# Patient Record
Sex: Male | Born: 1970 | Race: White | Hispanic: No | Marital: Married | Smoking: Never smoker
Health system: Southern US, Community
[De-identification: ages and names within clinical notes are randomized; demographics above are authoritative.]

## PROBLEM LIST (undated history)

## (undated) DIAGNOSIS — J45909 Unspecified asthma, uncomplicated: Secondary | ICD-10-CM

## (undated) DIAGNOSIS — I10 Essential (primary) hypertension: Secondary | ICD-10-CM

## (undated) DIAGNOSIS — S060X9A Concussion with loss of consciousness of unspecified duration, initial encounter: Secondary | ICD-10-CM

## (undated) DIAGNOSIS — S0292XA Unspecified fracture of facial bones, initial encounter for closed fracture: Secondary | ICD-10-CM

## (undated) DIAGNOSIS — D497 Neoplasm of unspecified behavior of endocrine glands and other parts of nervous system: Secondary | ICD-10-CM

## (undated) DIAGNOSIS — F419 Anxiety disorder, unspecified: Secondary | ICD-10-CM

## (undated) DIAGNOSIS — E785 Hyperlipidemia, unspecified: Secondary | ICD-10-CM

## (undated) DIAGNOSIS — E669 Obesity, unspecified: Secondary | ICD-10-CM

## (undated) DIAGNOSIS — T7840XA Allergy, unspecified, initial encounter: Secondary | ICD-10-CM

## (undated) DIAGNOSIS — M1990 Unspecified osteoarthritis, unspecified site: Secondary | ICD-10-CM

## (undated) DIAGNOSIS — E221 Hyperprolactinemia: Secondary | ICD-10-CM

## (undated) DIAGNOSIS — R7989 Other specified abnormal findings of blood chemistry: Secondary | ICD-10-CM

## (undated) HISTORY — DX: Essential (primary) hypertension: I10

## (undated) HISTORY — DX: Hyperprolactinemia: E22.1

## (undated) HISTORY — DX: Hyperlipidemia, unspecified: E78.5

## (undated) HISTORY — DX: Anxiety disorder, unspecified: F41.9

## (undated) HISTORY — DX: Unspecified asthma, uncomplicated: J45.909

## (undated) HISTORY — DX: Allergy, unspecified, initial encounter: T78.40XA

## (undated) HISTORY — DX: Unspecified osteoarthritis, unspecified site: M19.90

## (undated) HISTORY — DX: Concussion with loss of consciousness of unspecified duration, initial encounter: S06.0X9A

## (undated) HISTORY — DX: Neoplasm of unspecified behavior of endocrine glands and other parts of nervous system: D49.7

## (undated) HISTORY — PX: COLONOSCOPY: SHX174

## (undated) HISTORY — DX: Obesity, unspecified: E66.9

## (undated) HISTORY — DX: Other specified abnormal findings of blood chemistry: R79.89

## (undated) HISTORY — DX: Unspecified fracture of facial bones, initial encounter for closed fracture: S02.92XA

---
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
## 2012-05-23 ENCOUNTER — Ambulatory Visit (INDEPENDENT_AMBULATORY_CARE_PROVIDER_SITE_OTHER): Payer: BC Managed Care – PPO | Admitting: Physician Assistant

## 2012-05-23 VITALS — BP 142/98 | HR 88 | Temp 97.7°F | Resp 16 | Ht 73.0 in | Wt 290.4 lb

## 2012-05-23 DIAGNOSIS — R05 Cough: Secondary | ICD-10-CM

## 2012-05-23 DIAGNOSIS — J40 Bronchitis, not specified as acute or chronic: Secondary | ICD-10-CM

## 2012-05-23 DIAGNOSIS — R059 Cough, unspecified: Secondary | ICD-10-CM

## 2012-05-23 DIAGNOSIS — J309 Allergic rhinitis, unspecified: Secondary | ICD-10-CM

## 2012-05-23 MED ORDER — METHYLPREDNISOLONE ACETATE 80 MG/ML IJ SUSP
80.0000 mg | Freq: Once | INTRAMUSCULAR | Status: AC
  Administered 2012-05-23: 80 mg via INTRAMUSCULAR

## 2012-05-23 MED ORDER — ALBUTEROL SULFATE HFA 108 (90 BASE) MCG/ACT IN AERS: 2.0000 | INHALATION_SPRAY | RESPIRATORY_TRACT | Status: DC | PRN

## 2012-05-23 MED ORDER — IPRATROPIUM BROMIDE 0.03 % NA SOLN: 2.0000 | Freq: Two times a day (BID) | NASAL | Status: DC

## 2012-05-23 MED ORDER — HYDROCOD POLST-CHLORPHEN POLST 10-8 MG/5ML PO LQCR: 5.0000 mL | Freq: Two times a day (BID) | ORAL | Status: DC | PRN

## 2012-05-23 MED ORDER — AZITHROMYCIN 250 MG PO TABS: ORAL_TABLET | ORAL | Status: DC

## 2012-05-23 NOTE — Progress Notes (Signed)
  Subjective:    Patient ID: David Aguilar, male    DOB: 12-15-1970, 41 y.o.   MRN: [id number]  HPI 41 year old male presents with 3 week history of cough, allergic rhinitis, nasal congestion, and postnasal drainage.  Denies sore throat, otalgia, fevers, chills, nausea, or vomiting.  No history of asthma or inhaler use, but does admit to some wheezing with this illness.  Says his wife and young daughter are both sick as well with cough and congestion. He has been taking Allegra daily x 4 weeks but says it is not helping much.  No history of diabetes or hypertension, but says blood pressure has been borderline. Has been working on exercise and weight loss, and has lost about 5 pounds since last month.      Review of Systems  Constitutional: Negative for fever and chills.  HENT: Positive for congestion, rhinorrhea and postnasal drip. Negative for ear pain, sore throat and neck pain.   Respiratory: Positive for cough and chest tightness.   Cardiovascular: Negative for chest pain.  Gastrointestinal: Negative for nausea and vomiting.  All other systems reviewed and are negative.       Objective:   Physical Exam  Constitutional: He is oriented to person, place, and time. He appears well-developed and well-nourished.  HENT:  Head: Normocephalic and atraumatic.  Right Ear: External ear normal.  Left Ear: External ear normal.  Mouth/Throat: No oropharyngeal exudate.  Eyes: Conjunctivae normal are normal.  Neck: Normal range of motion.  Cardiovascular: Normal rate, regular rhythm and normal heart sounds.   Pulmonary/Chest: Effort normal and breath sounds normal.  Musculoskeletal: Normal range of motion.  Lymphadenopathy:    He has no cervical adenopathy.  Neurological: He is alert and oriented to person, place, and time.  Psychiatric: He has a normal mood and affect. His behavior is normal. Judgment and thought content normal.          Assessment & Plan:   1. Allergic rhinitis   methylPREDNISolone acetate (DEPO-MEDROL) injection 80 mg, ipratropium (ATROVENT) 0.03 % nasal spray  2. Cough  chlorpheniramine-HYDROcodone (TUSSIONEX PENNKINETIC ER) 10-8 MG/5ML LQCR  3. Bronchitis  azithromycin (ZITHROMAX) 250 MG tablet, albuterol (PROVENTIL HFA;VENTOLIN HFA) 108 (90 BASE) MCG/ACT inhaler   Continue Allegra daily Zpack Albuterol as needed for chest tightness.  Atrovent NS bid Tussionex prn cough

## 2012-08-16 ENCOUNTER — Ambulatory Visit (INDEPENDENT_AMBULATORY_CARE_PROVIDER_SITE_OTHER): Payer: BC Managed Care – PPO | Admitting: Family Medicine

## 2012-08-16 ENCOUNTER — Encounter: Payer: Self-pay | Admitting: Family Medicine

## 2012-08-16 VITALS — BP 136/95 | HR 95 | Temp 98.0°F | Resp 18 | Ht 73.5 in | Wt 297.0 lb

## 2012-08-16 DIAGNOSIS — R059 Cough, unspecified: Secondary | ICD-10-CM

## 2012-08-16 DIAGNOSIS — R030 Elevated blood-pressure reading, without diagnosis of hypertension: Secondary | ICD-10-CM

## 2012-08-16 DIAGNOSIS — J309 Allergic rhinitis, unspecified: Secondary | ICD-10-CM

## 2012-08-16 DIAGNOSIS — R05 Cough: Secondary | ICD-10-CM

## 2012-08-16 MED ORDER — FLUTICASONE PROPIONATE 50 MCG/ACT NA SUSP: 2.0000 | Freq: Every day | NASAL | Status: DC

## 2012-08-16 MED ORDER — MONTELUKAST SODIUM 10 MG PO TABS: 10.0000 mg | ORAL_TABLET | Freq: Every day | ORAL | Status: DC

## 2012-08-16 NOTE — Progress Notes (Signed)
S: This 42 y.o. Cauc male has hx of allergies dating back decades. He has tried most antihistamines and recalls using Flonase in the 1990s w/ good results. He has a dry cough and drainage in back of throat that is very irritating. Has chronic "throat-clearing" and notes occasional wheezing. He breaths better at night when using Breath-Right Strips; also has used OTC nasal spray (in place of a NETI pot).  Uses a humidifier. Wife is concerned that he may have sleep apnea; pt denies daytime sleepiness, feels well-rested upon awakening and has no urge to nap during the day.  Pt also concerned about elevated bP reading obtained at home; average readings= 120-150/70-90. He tries to walk most days of the week and BP reading is much better after exercise (120/70). He denies CP or tightness, SOb or DOE, palpitations, edema, myalgias, HA, dizziness, weakness, numbness or syncope.  ROS; As per HPI>  O:  Filed Vitals:   08/16/12 1103 08/16/12 1158  BP: 158/105 136/95  Pulse: 95 95  Temp: 98 F (36.7 C)   Resp: 18   Height: 6' 1.5" (1.867 m)   Weight: 297 lb (134.718 kg)    GNE: In NAD; WN,WD. Obese. HEENT: [place]/AT; EOMI w/ clear conj/sclerae. EACs normal. TMs dull. Nontender sinuses w/ percussion. Post ph is somewhat narrow, erythematous. NECK: Supple w/o LAN or TMG. COR: RRR.; no m/g/r. Normal S1, S2. LUNGS: CTA w/o wheezes or rhonchi. Normal resp rate and effort. SKIN: W&D; no pallor or rashes. NEURO: A&O x 3; CNs intact. Nonfocal.   A/P:  1. Allergic rhinitis   RX: Fluticasone NS  2 spr EN hs; continue other measures. RX: Montelukast 10 mg   1 tablet at bedtime.  2. Cough    Use Albuterol MDI prn wheezing  3. Elevated blood-pressure reading without diagnosis of hypertension   Weight reduction.

## 2012-08-16 NOTE — Patient Instructions (Addendum)
Allergic Rhinitis Allergic rhinitis is when the mucous membranes in the nose respond to allergens. Allergens are particles in the air that cause your body to have an allergic reaction. This causes you to release allergic antibodies. Through a chain of events, these eventually cause you to release histamine into the blood stream (hence the use of antihistamines). Although meant to be protective to the body, it is this release that causes your discomfort, such as frequent sneezing, congestion and an itchy runny nose.  CAUSES  The pollen allergens may come from grasses, trees, and weeds. This is seasonal allergic rhinitis, or "hay fever." Other allergens cause year-round allergic rhinitis (perennial allergic rhinitis) such as house dust mite allergen, pet dander and mold spores.  SYMPTOMS   Nasal stuffiness (congestion).  Runny, itchy nose with sneezing and tearing of the eyes.  There is often an itching of the mouth, eyes and ears. It cannot be cured, but it can be controlled with medications. DIAGNOSIS  If you are unable to determine the offending allergen, skin or blood testing may find it. TREATMENT   Avoid the allergen.  Medications and allergy shots (immunotherapy) can help.  Hay fever may often be treated with antihistamines in pill or nasal spray forms. Antihistamines block the effects of histamine. There are over-the-counter medicines that may help with nasal congestion and swelling around the eyes. Check with your caregiver before taking or giving this medicine. If the treatment above does not work, there are many new medications your caregiver can prescribe. Stronger medications may be used if initial measures are ineffective. Desensitizing injections can be used if medications and avoidance fails. Desensitization is when a patient is given ongoing shots until the body becomes less sensitive to the allergen. Make sure you follow up with your caregiver if problems continue. SEEK MEDICAL  CARE IF:   You develop fever (more than 100.5 F (38.1 C).  You develop a cough that does not stop easily (persistent).  You have shortness of breath.  You start wheezing.  Symptoms interfere with normal daily activities. Document Released: 04/11/2001 Document Revised: 10/09/2011 Document Reviewed: 10/21/2008 [facility] Patient Information 2013 [place], [place].    Hypertension Hypertension is another name for high blood pressure. High blood pressure may mean that your heart needs to work harder to pump blood. Blood pressure consists of two numbers, which includes a higher number over a lower number (example: 110/72). HOME CARE   Make lifestyle changes as told by your doctor. This may include weight loss and exercise.  Take your blood pressure medicine every day.  Limit how much salt you use.  Stop smoking if you smoke.  Do not use drugs.  Talk to your doctor if you are using decongestants or birth control pills. These medicines might make blood pressure higher.  Females should not drink more than 1 alcoholic drink per day. Males should not drink more than 2 alcoholic drinks per day.  See your doctor as told. GET HELP RIGHT AWAY IF:   You have a blood pressure reading with a top number of 180 or higher.  You get a very bad headache.  You get blurred or changing vision.  You feel confused.  You feel weak, numb, or faint.  You get chest or belly (abdominal) pain.  You throw up (vomit).  You cannot breathe very well. MAKE SURE YOU:   Understand these instructions.  Will watch your condition.  Will get help right away if you are not doing well or get worse.  Document Released: 01/03/2008 Document Revised: 10/09/2011 Document Reviewed: 01/03/2008 [facility] Patient Information 2013 [place], [place].

## 2012-09-19 ENCOUNTER — Telehealth: Payer: Self-pay

## 2012-09-19 NOTE — Telephone Encounter (Signed)
I do not routinely prescribe antibiotics for "sinus infections" without seeing the patient. He may have to try to get evaluated at [facility]. He may want to try OTC Mucinex D, use a humidifier and OTC saline nasal rinse.

## 2012-09-19 NOTE — Telephone Encounter (Signed)
I have advised patient 

## 2012-09-19 NOTE — Telephone Encounter (Signed)
David Aguilar - PT HAD TO CANCEL HIS CPE DUE TO THE FACT THAT HIS WIFE IS SCHEDULED TO DELIVER TWINS ON THAT DAY.  SAYS HE IS GETTING A SINUS INFECTION AND WONDERED IF THERE IS ANY WAY David Aguilar COULD CALL HIM IN A Z-PACK.  SAYS HE HAS BEEN TREATED FOR THIS BEFORE.  HE PLANS TO RESCHEDULE AS SOON AS HE CAN.  PLEASE CALL [phone]

## 2012-09-25 ENCOUNTER — Encounter: Payer: BC Managed Care – PPO | Admitting: Family Medicine

## 2012-11-12 ENCOUNTER — Telehealth: Payer: Self-pay

## 2012-11-12 DIAGNOSIS — J40 Bronchitis, not specified as acute or chronic: Secondary | ICD-10-CM

## 2012-11-12 MED ORDER — ALBUTEROL SULFATE HFA 108 (90 BASE) MCG/ACT IN AERS: 2.0000 | INHALATION_SPRAY | RESPIRATORY_TRACT | Status: DC | PRN

## 2012-11-12 NOTE — Telephone Encounter (Signed)
I refilled Albuterol w/ additional refills so that pt should have enough medication until he can come in.

## 2012-11-12 NOTE — Telephone Encounter (Signed)
Patient had to reschedule his CPE in February due to the birth of his twins.  He has now rescheduled it with [facility] for May.  Wants to know if we can please refill his albuterol inhaler until then.  Says the pharmacy sent over a refill request four-five days ago, but has not heard back from [place].  [phone]

## 2012-11-12 NOTE — Telephone Encounter (Signed)
Thanks, I did advise him.

## 2012-12-12 ENCOUNTER — Encounter: Payer: Self-pay | Admitting: Family Medicine

## 2012-12-12 ENCOUNTER — Ambulatory Visit (INDEPENDENT_AMBULATORY_CARE_PROVIDER_SITE_OTHER): Payer: BC Managed Care – PPO | Admitting: Family Medicine

## 2012-12-12 VITALS — BP 131/92 | HR 100 | Temp 97.4°F | Resp 17 | Wt 290.0 lb

## 2012-12-12 DIAGNOSIS — L409 Psoriasis, unspecified: Secondary | ICD-10-CM

## 2012-12-12 DIAGNOSIS — Z0000 Encounter for general adult medical examination without abnormal findings: Secondary | ICD-10-CM

## 2012-12-12 DIAGNOSIS — L408 Other psoriasis: Secondary | ICD-10-CM

## 2012-12-12 DIAGNOSIS — Z800 Family history of malignant neoplasm of digestive organs: Secondary | ICD-10-CM

## 2012-12-12 DIAGNOSIS — M7660 Achilles tendinitis, unspecified leg: Secondary | ICD-10-CM | POA: Insufficient documentation

## 2012-12-12 DIAGNOSIS — E669 Obesity, unspecified: Secondary | ICD-10-CM

## 2012-12-12 DIAGNOSIS — I10 Essential (primary) hypertension: Secondary | ICD-10-CM

## 2012-12-12 LAB — POCT URINALYSIS DIPSTICK
Bilirubin, UA: NEGATIVE
Glucose, UA: NEGATIVE
Ketones, UA: NEGATIVE
Leukocytes, UA: NEGATIVE
Nitrite, UA: NEGATIVE
Protein, UA: NEGATIVE
Spec Grav, UA: >=1.03
Urobilinogen, UA: 0.2
pH, UA: 6

## 2012-12-12 LAB — COMPREHENSIVE METABOLIC PANEL
ALT: 35 U/L (ref 0–53)
AST: 27 U/L (ref 0–37)
Albumin: 4.3 g/dL (ref 3.5–5.2)
Alkaline Phosphatase: 73 U/L (ref 39–117)
BUN: 16 mg/dL (ref 6–23)
CO2: 27 mEq/L (ref 19–32)
Calcium: 9.6 mg/dL (ref 8.4–10.5)
Chloride: 102 mEq/L (ref 96–112)
Creat: 0.72 mg/dL (ref 0.50–1.35)
Glucose, Bld: 78 mg/dL (ref 70–99)
Potassium: 4.5 mEq/L (ref 3.5–5.3)
Sodium: 138 mEq/L (ref 135–145)
Total Bilirubin: 0.5 mg/dL (ref 0.3–1.2)
Total Protein: 7.3 g/dL (ref 6.0–8.3)

## 2012-12-12 LAB — CBC WITH DIFFERENTIAL/PLATELET
Basophils Absolute: 0 10*3/uL (ref 0.0–0.1)
Basophils Relative: 0 % (ref 0–1)
Eosinophils Absolute: 0.4 10*3/uL (ref 0.0–0.7)
Eosinophils Relative: 4 % (ref 0–5)
HCT: 37.6 % — ABNORMAL LOW (ref 39.0–52.0)
Hemoglobin: 13.1 g/dL (ref 13.0–17.0)
Lymphocytes Relative: 27 % (ref 12–46)
Lymphs Abs: 2.6 10*3/uL (ref 0.7–4.0)
MCH: 28.3 pg (ref 26.0–34.0)
MCHC: 34.8 g/dL (ref 30.0–36.0)
MCV: 81.2 fL (ref 78.0–100.0)
Monocytes Absolute: 0.8 10*3/uL (ref 0.1–1.0)
Monocytes Relative: 8 % (ref 3–12)
Neutro Abs: 6.1 10*3/uL (ref 1.7–7.7)
Neutrophils Relative %: 61 % (ref 43–77)
Platelets: 301 10*3/uL (ref 150–400)
RBC: 4.63 MIL/uL (ref 4.22–5.81)
RDW: 14.4 % (ref 11.5–15.5)
WBC: 9.9 10*3/uL (ref 4.0–10.5)

## 2012-12-12 LAB — LIPID PANEL
Cholesterol: 209 mg/dL — ABNORMAL HIGH (ref 0–200)
HDL: 51 mg/dL (ref >39)
LDL Cholesterol: 132 mg/dL — ABNORMAL HIGH (ref 0–99)
Total CHOL/HDL Ratio: 4.1 Ratio
Triglycerides: 128 mg/dL (ref <150)
VLDL: 26 mg/dL (ref 0–40)

## 2012-12-12 MED ORDER — ATENOLOL-CHLORTHALIDONE 50-25 MG PO TABS: ORAL_TABLET | ORAL | Status: DC

## 2012-12-12 MED ORDER — CLOBETASOL PROPIONATE 0.05 % EX CREA: TOPICAL_CREAM | Freq: Two times a day (BID) | CUTANEOUS | Status: DC

## 2012-12-12 MED ORDER — MONTELUKAST SODIUM 10 MG PO TABS: 10.0000 mg | ORAL_TABLET | Freq: Every day | ORAL | Status: DC

## 2012-12-12 NOTE — Progress Notes (Signed)
Subjective:    Patient ID: David Aguilar, male    DOB: Nov 13, 1970, 42 y.o.   MRN: [id number]  HPI  This 42 y.o. Cauc male is here for CPE. He has moderately severe allergies w/ broncho-  spasms significantly improved on Montelukast. He uses Albuterol sparingly. He and his wife   recently welcomed fraternal twins into the family; they also have a 2-yr old daughter. Pt is now  focused on getting healthier. He has mild HTN but hopes to become normotensive w/ weight loss.  He works as a Geologist, engineering (sedentary w/ much travel) and has not been exercising. Most of  the weight gain has been in last 10 years. Pt recalls weight at HS grad= 180 lbs and at college  grad ~ 220 pounds.   Pt has hx of hypogonadism; he notes low energy. He and wife were able to get pregnant only  after having fertility treatment; pt received Clomid and Testosterone injections for brief period.  He reports feeling so much better during that time. He requests testosterone level be checked. His   male sibling also has "low-T".   PMHx, Surg Hx, Soc Hx and Fam Hx reviewed.   HCM: No recent vision care.            Dental- current.   Review of Systems  Constitutional: Positive for activity change and fatigue. Negative for diaphoresis, appetite change and unexpected weight change.  HENT: Positive for rhinorrhea and postnasal drip. Negative for sore throat, sneezing, trouble swallowing, dental problem and sinus pressure.   Eyes: Negative.   Respiratory: Negative.   Cardiovascular: Negative.   Gastrointestinal: Negative.   Endocrine: Negative.   Genitourinary: Negative.   Musculoskeletal: Negative.   Skin: Negative.   Allergic/Immunologic: Positive for environmental allergies. Negative for food allergies.  Neurological: Negative.   Hematological: Negative.   Psychiatric/Behavioral: Negative.        Objective:   Physical Exam  Nursing note and vitals reviewed. Constitutional: He is oriented to person, place,  and time. He appears well-developed and well-nourished. No distress.  Pt is obese.  HENT:  Head: Normocephalic and atraumatic.  Right Ear: Hearing, tympanic membrane, external ear and ear canal normal.  Left Ear: Hearing, tympanic membrane, external ear and ear canal normal.  Nose: Nose normal. No mucosal edema, rhinorrhea, sinus tenderness or nasal deformity.  Mouth/Throat: Uvula is midline and mucous membranes are normal. No oral lesions. Normal dentition. No dental caries. Posterior oropharyngeal erythema present. No oropharyngeal exudate or posterior oropharyngeal edema.  Eyes: Conjunctivae, EOM and lids are normal. Pupils are equal, round, and reactive to light. No scleral icterus.  Fundoscopic exam:      The right eye shows no arteriolar narrowing, no AV nicking and no papilledema. The right eye shows red reflex.       The left eye shows no arteriolar narrowing, no AV nicking and no papilledema. The left eye shows red reflex.  Neck: Normal range of motion. Neck supple. No JVD present. No thyromegaly present.  Cardiovascular: Normal rate, regular rhythm, normal heart sounds and intact distal pulses.  Exam reveals no gallop and no friction rub.   No murmur heard. Pulmonary/Chest: Effort normal and breath sounds normal. No respiratory distress. He has no wheezes. He has no rales.  Abdominal: Soft. Bowel sounds are normal. He exhibits no distension, no pulsatile midline mass and no mass. There is no hepatosplenomegaly. There is no tenderness. There is no rebound and no guarding. No hernia.  Musculoskeletal: He exhibits  no edema and no tenderness.       Right ankle: He exhibits decreased range of motion. He exhibits no swelling, no ecchymosis and no deformity. Achilles tendon exhibits pain and defect.       Left ankle: He exhibits decreased range of motion. He exhibits no swelling, no ecchymosis and no deformity. Achilles tendon exhibits pain and defect.  Achilles tendons thickened at midpoint  bilaterally. Sensitive to palpation. Pt can voluntarily plantar flex feet.  Lymphadenopathy:       Head (right side): No submental, no submandibular, no tonsillar, no posterior auricular and no occipital adenopathy present.       Head (left side): No submental, no submandibular, no tonsillar, no posterior auricular and no occipital adenopathy present.    He has no cervical adenopathy.       Right cervical: No posterior cervical adenopathy present.      Left cervical: No posterior cervical adenopathy present.    He has no axillary adenopathy.       Right: No inguinal and no supraclavicular adenopathy present.       Left: No inguinal and no supraclavicular adenopathy present.  Neurological: He is alert and oriented to person, place, and time. He has normal reflexes. No cranial nerve deficit. He exhibits normal muscle tone. Coordination normal.  Skin: Skin is warm and dry. No rash noted. No erythema. No pallor.  Psychiatric: He has a normal mood and affect. His behavior is normal. Judgment and thought content normal.    ECG:  NSR; no ST-TW changes. No ectopy.     Assessment & Plan:  Routine general medical examination at a health care facility - Plan: POCT urinalysis dipstick, Testosterone, free, total, Vitamin D 25 hydroxy, Comprehensive metabolic panel, Lipid panel, CBC with Differential  Unspecified essential hypertension - Plan: EKG 12-Lead, Comprehensive metabolic panel.  RX: Atenolol- Chlorthalidone 50-25 mg 1/2 tablet daily; weight reduction and nutrition modification.  Achilles tendonitis, unspecified laterality - Plan: Ambulatory referral to Orthopedic Surgery  Psoriasis - Plan: Ambulatory referral to Dermatology; RX: Clobetasol cream 0.05%  Apply bid to affected areas.  Obesity, unspecified- pt plans to address lifestyle changes.  Family history of colon cancer - Plan: Ambulatory referral to Gastroenterology

## 2012-12-12 NOTE — Patient Instructions (Addendum)
Keeping you healthy  Get these tests  Blood pressure- Have your blood pressure checked once a year by your healthcare provider.  Normal blood pressure is 120/80.  Weight- Have your body mass index (BMI) calculated to screen for obesity.  BMI is a measure of body fat based on height and weight. You can also calculate your own BMI at https://www.west-esparza.com/.  Cholesterol- Have your cholesterol checked regularly starting at age 42, sooner may be necessary if you have diabetes, high blood pressure, if a family member developed heart diseases at an early age or if you smoke.   Chlamydia, HIV, and other sexual transmitted disease- Get screened each year until the age of 29 then within three months of each new sexual partner.  Diabetes- Have your blood sugar checked regularly if you have high blood pressure, high cholesterol, a family history of diabetes or if you are overweight.  Get these vaccines  Flu shot- Every fall.  Tetanus shot- Every 10 years. Next dose due in 2022.  Menactra- Single dose; prevents meningitis.  Take these steps  Don't smoke- If you do smoke, ask your healthcare provider about quitting. For tips on how to quit, go to www.smokefree.gov or call 1-800-QUIT-NOW.  Be physically active- Exercise 5 days a week for at least 30 minutes.  If you are not already physically active start slow and gradually work up to 30 minutes of moderate physical activity.  Examples of moderate activity include walking briskly, mowing the yard, dancing, swimming bicycling, etc.  Eat a healthy diet- Eat a variety of healthy foods such as fruits, vegetables, low fat milk, low fat cheese, yogurt, lean meats, poultry, fish, beans, tofu, etc.  For more information on healthy eating, go to www.thenutritionsource.org  Drink alcohol in moderation- Limit alcohol intake two drinks or less a day.  Never drink and drive.  Dentist- Brush and floss teeth twice daily; visit your dentis twice a  year.  Depression-Your emotional health is as important as your physical health.  If you're feeling down, losing interest in things you normally enjoy please talk with your healthcare provider.  Gun Safety- If you keep a gun in your home, keep it unloaded and with the safety lock on.  Bullets should be stored separately.  Helmet use- Always wear a helmet when riding a motorcycle, bicycle, rollerblading or skateboarding.  Safe sex- If you may be exposed to a sexually transmitted infection, use a condom  Seat belts- Seat bels can save your life; always wear one.  Smoke/Carbon Monoxide detectors- These detectors need to be installed on the appropriate level of your home.  Replace batteries at least once a year.  Skin Cancer- When out in the sun, cover up and use sunscreen SPF 15 or higher.  Violence- If anyone is threatening or hurting you, please tell your healthcare provider.    Psoriasis Psoriasis is a common, long-lasting (chronic) inflammation of the skin. It affects both men and women equally, of all ages and all races. Psoriasis cannot be passed from person to person (not contagious). Psoriasis varies from mild to very severe. When severe, it can greatly affect your quality of life. Psoriasis is an inflammatory disorder affecting the skin as well as other organs including the joints (causing an arthritis). With psoriasis, the skin sheds its top layer of cells more rapidly than it does in someone without psoriasis. CAUSES  The cause of psoriasis is largely unknown. Genetics, your immune system, and the environment seem to play a role in causing  psoriasis. Factors that can make psoriasis worse include:  Damage or trauma to the skin, such as cuts, scrapes, and sunburn. This damage often causes new areas of psoriasis (lesions).  Winter dryness and lack of sunlight.  Medicines such as lithium, beta-blockers, antimalarial drugs, ACE inhibitors, nonsteroidal anti-inflammatory drugs  (ibuprofen, aspirin), and terbinafine. Let your caregiver know if you are taking any of these drugs.  Alcohol. Excessive alcohol use should be avoided if you have psoriasis. Drinking large amounts of alcohol can affect:  How well your psoriasis treatment works.  How safe your psoriasis treatment is.  Smoking. If you smoke, ask your caregiver for help to quit.  Stress.  Bacterial or viral infections.  Arthritis. Arthritis associated with psoriasis (psoriatic arthritis) affects less than 10% of patients with psoriasis. The arthritic intensity does not always match the skin psoriasis intensity. It is important to let your caregiver know if your joints hurt or if they are stiff. SYMPTOMS  The most common form of psoriasis begins with little red bumps that gradually become larger. The bumps begin to form scales that flake off easily. The lower layers of scales stick together. When these scales are scratched or removed, the underlying skin is tender and bleeds easily. These areas then grow in size and may become large. Psoriasis often creates a rash that looks the same on both sides of the body (symmetrical). It often affects the elbows, knees, groin, genitals, arms, legs, scalp, and nails. Affected nails often have pitting, loosen, thicken, crumble, and are difficult to treat.  "Inverse psoriasis"occurs in the armpits, under breasts, in skin folds, and around the groin, buttocks, and genitals.  "Guttate psoriasis" generally occurs in children and young adults following a recent sore throat (strep throat). It begins with many small, red, scaly spots on the skin. It clears spontaneously in weeks or a few months without treatment. DIAGNOSIS  Psoriasis is diagnosed by physical exam. A tissue sample (biopsy) may also be taken. TREATMENT The treatment of psoriasis depends on your age, health, and living conditions.  Steroid (cortisone) creams, lotions, and ointments may be used. These treatments are  associated with thinning of the skin, blood vessels that get larger (dilated), loss of skin pigmentation, and easy bruising. It is important to use these steroids as directed by your caregiver. Only treat the affected areas and not the normal, unaffected skin. People on long-term steroid treatment should wear a medical alert bracelet. Injections may be used in areas that are difficult to treat.  Scalp treatments are available as shampoos, solutions, sprays, foams, and oils. Avoid scratching the scalp and picking at the scales.  Anthralin medicine works well on areas that are difficult to treat. However, it stains clothes and skin and may cause temporary irritation.  Synthetic vitamin D (calcipotriene)can be used on small areas. It is available by prescription. The forms of synthetic vitamin D available in health food stores do not help with psoriasis.  Coal tarsare available in various strengths for psoriasis that is difficult to treat. They are one of the longest used treatments for difficult to treat psoriasis. However, they are messy to use.  Light therapy (UV therapy) can be carefully and professionally monitored in a dermatologist's office. Careful sunbathing is helpful for many people as directed by your caregiver. The exposure should be just long enough to cause a mild redness (erythema) of your skin. Avoid sunburn as this may make the condition worse. Sunscreen (SPF of 30 or higher) should be used to protect against  sunburn. Cataracts, wrinkles, and skin aging are some of the harmful side effects of light therapy.  If creams (topical medicines) fail, there are several other options for systemic or oral medicines your caregiver can suggest. Psoriasis can sometimes be very difficult to treat. It can come and go. It is necessary to follow up with your caregiver regularly if your psoriasis is difficult to treat. Usually, with persistence you can get a good amount of relief. Maintaining consistent  care is important. Do not change caregivers just because you do not see immediate results. It may take several trials to find the right combination of treatment for you. PREVENTING FLARE-UPS  Wear gloves while you wash dishes, while cleaning, and when you are outside in the cold.  If you have radiators, place a bowl of water or damp towel on the radiator. This will help put water back in the air. You can also use a humidifier to keep the air moist. Try to keep the humidity at about 60% in your home.  Apply moisturizer while your skin is still damp from bathing or showering. This traps water in the skin.  Avoid long, hot baths or showers. Keep soap use to a minimum. Soaps dry out the skin and wash away the protective oils. Use a fragrance free, dye free soap.  Drink enough water and fluids to keep your urine clear or pale yellow. Not drinking enough water depletes your skin's water supply.  Turn off the heat at night and keep it low during the day. Cool air is less drying. SEEK MEDICAL CARE IF:  You have increasing pain in the affected areas.  You have uncontrolled bleeding in the affected areas.  You have increasing redness or warmth in the affected areas.  You start to have pain or stiffness in your joints.  You start feeling depressed about your condition.  You have a fever. Document Released: 07/14/2000 Document Revised: 10/09/2011 Document Reviewed: 01/09/2011 [facility] Patient Information 2013 [place], [place].    Hypertension As your heart beats, it forces blood through your arteries. This force is your blood pressure. If the pressure is too high, it is called hypertension (HTN) or high blood pressure. HTN is dangerous because you may have it and not know it. High blood pressure may mean that your heart has to work harder to pump blood. Your arteries may be narrow or stiff. The extra work puts you at risk for heart disease, stroke, and other problems.  Blood pressure consists of  two numbers, a higher number over a lower, 110/72, for example. It is stated as "110 over 72." The ideal is below 120 for the top number (systolic) and under 80 for the bottom (diastolic). Write down your blood pressure today. You should pay close attention to your blood pressure if you have certain conditions such as:  Heart failure.  Prior heart attack.  Diabetes  Chronic kidney disease.  Prior stroke.  Multiple risk factors for heart disease. To see if you have HTN, your blood pressure should be measured while you are seated with your arm held at the level of the heart. It should be measured at least twice. A one-time elevated blood pressure reading (especially in the Emergency Department) does not mean that you need treatment. There may be conditions in which the blood pressure is different between your right and left arms. It is important to see your caregiver soon for a recheck. Most people have essential hypertension which means that there is not a specific  cause. This type of high blood pressure may be lowered by changing lifestyle factors such as:  Stress.  Smoking.  Lack of exercise.  Excessive weight.  Drug/tobacco/alcohol use.  Eating less salt. Most people do not have symptoms from high blood pressure until it has caused damage to the body. Effective treatment can often prevent, delay or reduce that damage. TREATMENT  When a cause has been identified, treatment for high blood pressure is directed at the cause. There are a large number of medications to treat HTN. These fall into several categories, and your caregiver will help you select the medicines that are best for you. Medications may have side effects. You should review side effects with your caregiver. If your blood pressure stays high after you have made lifestyle changes or started on medicines,   Your medication(s) may need to be changed.  Other problems may need to be addressed.  Be certain you understand  your prescriptions, and know how and when to take your medicine.  Be sure to follow up with your caregiver within the time frame advised (usually within two weeks) to have your blood pressure rechecked and to review your medications.  If you are taking more than one medicine to lower your blood pressure, make sure you know how and at what times they should be taken. Taking two medicines at the same time can result in blood pressure that is too low. SEEK IMMEDIATE MEDICAL CARE IF:  You develop a severe headache, blurred or changing vision, or confusion.  You have unusual weakness or numbness, or a faint feeling.  You have severe chest or abdominal pain, vomiting, or breathing problems. MAKE SURE YOU:   Understand these instructions.  Will watch your condition.  Will get help right away if you are not doing well or get worse. Document Released: 07/17/2005 Document Revised: 10/09/2011 Document Reviewed: 03/06/2008 [facility] Patient Information 2013 [place], [place].

## 2012-12-13 DIAGNOSIS — I10 Essential (primary) hypertension: Secondary | ICD-10-CM | POA: Insufficient documentation

## 2012-12-13 DIAGNOSIS — Z800 Family history of malignant neoplasm of digestive organs: Secondary | ICD-10-CM | POA: Insufficient documentation

## 2012-12-13 LAB — TESTOSTERONE, FREE, TOTAL, SHBG
Sex Hormone Binding: 13 nmol/L (ref 13–71)
Testosterone, Free: 11.4 pg/mL — ABNORMAL LOW (ref 47.0–244.0)
Testosterone-% Free: 2.8 % (ref 1.6–2.9)
Testosterone: 41 ng/dL — ABNORMAL LOW (ref 300–890)

## 2012-12-13 LAB — VITAMIN D 25 HYDROXY (VIT D DEFICIENCY, FRACTURES): Vit D, 25-Hydroxy: 35 ng/mL (ref 30–89)

## 2012-12-15 NOTE — Progress Notes (Signed)
Quick Note:  Please contact pt and advise the following about labs results...  Metabolic panel, blood counts and Vitamin D are normal. Lipid panel is good; total cholesterol and LDL ("bad") cholesterol are a little above normal. With improved nutrition and weight loss, these values will come down. HDL ("good") cholesterol is in the 50s; this is good.  Testosterone numbers are very low. As we discussed, I would refer you to an Endocrine specialist for further treatment of this condition if you want further evaluation or treatment. Please contact the office if your want to proceed with a referral. The clinical message team can advise me of how to proceed.  Copy to pt. ______

## 2012-12-17 ENCOUNTER — Other Ambulatory Visit: Payer: Self-pay | Admitting: Family Medicine

## 2012-12-17 DIAGNOSIS — E349 Endocrine disorder, unspecified: Secondary | ICD-10-CM

## 2012-12-20 ENCOUNTER — Encounter: Payer: Self-pay | Admitting: Gastroenterology

## 2012-12-31 ENCOUNTER — Ambulatory Visit: Payer: Self-pay | Admitting: Internal Medicine

## 2013-01-01 ENCOUNTER — Ambulatory Visit: Payer: Self-pay | Admitting: Gastroenterology

## 2013-01-22 ENCOUNTER — Ambulatory Visit (INDEPENDENT_AMBULATORY_CARE_PROVIDER_SITE_OTHER): Payer: BC Managed Care – PPO | Admitting: Emergency Medicine

## 2013-01-22 VITALS — BP 106/78 | HR 92 | Temp 99.0°F | Resp 14 | Ht 72.75 in | Wt 285.4 lb

## 2013-01-22 DIAGNOSIS — J018 Other acute sinusitis: Secondary | ICD-10-CM

## 2013-01-22 DIAGNOSIS — J209 Acute bronchitis, unspecified: Secondary | ICD-10-CM

## 2013-01-22 MED ORDER — PSEUDOEPHEDRINE-GUAIFENESIN ER 60-600 MG PO TB12: 1.0000 | ORAL_TABLET | Freq: Two times a day (BID) | ORAL | Status: AC

## 2013-01-22 MED ORDER — HYDROCOD POLST-CHLORPHEN POLST 10-8 MG/5ML PO LQCR: 5.0000 mL | Freq: Two times a day (BID) | ORAL | Status: DC | PRN

## 2013-01-22 MED ORDER — AMOXICILLIN-POT CLAVULANATE 875-125 MG PO TABS: 1.0000 | ORAL_TABLET | Freq: Two times a day (BID) | ORAL | Status: DC

## 2013-01-22 NOTE — Patient Instructions (Signed)

## 2013-01-22 NOTE — Progress Notes (Signed)
[facility] [address] [place] [address] [phone]  Date:  01/22/2013   Name:  David Aguilar   DOB:  Dec 04, 1970   MRN:  [id number]  PCP:  No primary provider on file.    Chief Complaint: Nasal Congestion, Cough and Fever   History of Present Illness:  David Aguilar is a 42 y.o. very pleasant male patient who presents with the following:  Ill for 1 week with fever, nasal congestion and drainage. No nausea or vomiting. Cough productive scant sputum  Some wheezing no shortness of breath.  Green nasal drainage.  No rash or sepsis.  No improvement with over the counter medications or other home remedies. Denies other complaint or health concern today.   Patient Active Problem List   Diagnosis Date Noted  . Unspecified essential hypertension 12/13/2012  . Family history of colon cancer 12/13/2012  . Obesity, unspecified 12/12/2012  . Achilles tendonitis 12/12/2012  . Allergic rhinitis 08/16/2012    Past Medical History  Diagnosis Date  . Allergy   . Arthritis     History reviewed. No pertinent past surgical history.  History  Substance Use Topics  . Smoking status: Never Smoker   . Smokeless tobacco: Not on file  . Alcohol Use: 0.6 oz/week    1 Glasses of wine per week    Family History  Problem Relation Age of Onset  . Hypertension Mother   . Hypertension Father     No Known Allergies  Medication list has been reviewed and updated.  Current Outpatient Prescriptions on File Prior to Visit  Medication Sig Dispense Refill  . albuterol (PROVENTIL HFA;VENTOLIN HFA) 108 (90 BASE) MCG/ACT inhaler Inhale 2 puffs into the lungs every 4 (four) hours as needed for wheezing (cough, shortness of breath or wheezing.).  1 Inhaler  2  . clobetasol cream (TEMOVATE) 0.05 % Apply topically 2 (two) times daily.  45 g  3  . fluticasone (FLONASE) 50 MCG/ACT nasal spray Place 2 sprays into the nose daily.  16 g  6  . montelukast (SINGULAIR) 10 MG  tablet Take 1 tablet (10 mg total) by mouth at bedtime.  30 tablet  11   No current facility-administered medications on file prior to visit.    Review of Systems:  As per HPI, otherwise negative.    Physical Examination: Filed Vitals:   01/22/13 1502  BP: 106/78  Pulse: 92  Temp: 99 F (37.2 C)  Resp: 14   Filed Vitals:   01/22/13 1502  Height: 6' 0.75" (1.848 m)  Weight: 285 lb 6.4 oz (129.457 kg)   Body mass index is 37.91 kg/(m^2). Ideal Body Weight: Weight in (lb) to have BMI = 25: 187.8  GEN: WDWN, NAD, Non-toxic, A & O x 3 HEENT: Atraumatic, Normocephalic. Neck supple. No masses, No LAD. Ears and Nose: No external deformity. CV: RRR, No M/G/R. No JVD. No thrill. No extra heart sounds. PULM: CTA B, no wheezes, crackles, rhonchi. No retractions. No resp. distress. No accessory muscle use. ABD: S, NT, ND, +BS. No rebound. No HSM. EXTR: No c/c/e NEURO Normal gait.  PSYCH: Normally interactive. Conversant. Not depressed or anxious appearing.  Calm demeanor.    Assessment and Plan: Sinusitis Bronchitis augmentin mucinex d tussionex   Signed,  Phillips Odor, MD

## 2013-02-13 ENCOUNTER — Ambulatory Visit (INDEPENDENT_AMBULATORY_CARE_PROVIDER_SITE_OTHER): Payer: BC Managed Care – PPO | Admitting: Emergency Medicine

## 2013-02-13 VITALS — BP 120/74 | HR 72 | Temp 97.3°F | Resp 18 | Ht 73.5 in | Wt 289.8 lb

## 2013-02-13 DIAGNOSIS — J209 Acute bronchitis, unspecified: Secondary | ICD-10-CM

## 2013-02-13 DIAGNOSIS — J018 Other acute sinusitis: Secondary | ICD-10-CM

## 2013-02-13 MED ORDER — MOMETASONE FURO-FORMOTEROL FUM 200-5 MCG/ACT IN AERO: 2.0000 | INHALATION_SPRAY | Freq: Two times a day (BID) | RESPIRATORY_TRACT | Status: DC

## 2013-02-13 MED ORDER — LEVOFLOXACIN 500 MG PO TABS: 500.0000 mg | ORAL_TABLET | Freq: Every day | ORAL | Status: AC

## 2013-02-13 NOTE — Progress Notes (Signed)
[facility] [address] [place] [address] [phone]  Date:  02/13/2013   Name:  David Aguilar   DOB:  [birth year]   MRN:  [id number]  PCP:  No primary provider on file.    Chief Complaint: Cough   History of Present Illness:  David Aguilar is a 42 y.o. very pleasant male patient who presents with the following:  Seen last month for sinusitis and bronchitis.  Improved with augmentin but not resolved.  Still has nasal congestion and drainage and post nasal drip.  Cough productive but does not expectorate.  No fever or chills. No nausea or vomiting.  No  shortness of breath.  Moderate wheezing mostly at night.  Not responding to albuterol  No improvement with over the counter medications or other home remedies. Denies other complaint or health concern today.   Patient Active Problem List   Diagnosis Date Noted  . Unspecified essential hypertension 12/13/2012  . Family history of colon cancer 12/13/2012  . Obesity, unspecified 12/12/2012  . Achilles tendonitis 12/12/2012  . Allergic rhinitis 08/16/2012    Past Medical History  Diagnosis Date  . Allergy   . Arthritis     No past surgical history on file.  History  Substance Use Topics  . Smoking status: Never Smoker   . Smokeless tobacco: Not on file  . Alcohol Use: 0.6 oz/week    1 Glasses of wine per week    Family History  Problem Relation Age of Onset  . Hypertension Mother   . Hypertension Father     No Known Allergies  Medication list has been reviewed and updated.  Current Outpatient Prescriptions on File Prior to Visit  Medication Sig Dispense Refill  . albuterol (PROVENTIL HFA;VENTOLIN HFA) 108 (90 BASE) MCG/ACT inhaler Inhale 2 puffs into the lungs every 4 (four) hours as needed for wheezing (cough, shortness of breath or wheezing.).  1 Inhaler  2  . atenolol-chlorthalidone (TENORETIC) 50-25 MG per tablet Take 0.5 tablets by mouth daily.      . clobetasol cream  (TEMOVATE) 0.05 % Apply topically 2 (two) times daily.  45 g  3  . fluticasone (FLONASE) 50 MCG/ACT nasal spray Place 2 sprays into the nose daily.  16 g  6  . montelukast (SINGULAIR) 10 MG tablet Take 1 tablet (10 mg total) by mouth at bedtime.  30 tablet  11  . amoxicillin-clavulanate (AUGMENTIN) 875-125 MG per tablet Take 1 tablet by mouth 2 (two) times daily.  20 tablet  0  . chlorpheniramine-HYDROcodone (TUSSIONEX PENNKINETIC ER) 10-8 MG/5ML LQCR Take 5 mLs by mouth every 12 (twelve) hours as needed.  60 mL  0  . pseudoephedrine-guaifenesin (MUCINEX D) 60-600 MG per tablet Take 1 tablet by mouth every 12 (twelve) hours.  18 tablet  0   No current facility-administered medications on file prior to visit.    Review of Systems:  As per HPI, otherwise negative.    Physical Examination: Filed Vitals:   02/13/13 1027  BP: 120/74  Pulse: 72  Temp: 97.3 F (36.3 C)  Resp: 18   Filed Vitals:   02/13/13 1027  Height: 6' 1.5" (1.867 m)  Weight: 289 lb 12.8 oz (131.452 kg)   Body mass index is 37.71 kg/(m^2). Ideal Body Weight: Weight in (lb) to have BMI = 25: 191.7  GEN: WDWN, NAD, Non-toxic, A & O x 3 HEENT: Atraumatic, Normocephalic. Neck supple. No masses, No LAD. Ears and Nose: No external  deformity. CV: RRR, No M/G/R. No JVD. No thrill. No extra heart sounds. PULM: CTA B, no  crackles, rhonchi. No retractions. No resp. distress. No accessory muscle use.  Wheezing bilaterally ABD: S, NT, ND, +BS. No rebound. No HSM. EXTR: No c/c/e NEURO Normal gait.  PSYCH: Normally interactive. Conversant. Not depressed or anxious appearing.  Calm demeanor.    Assessment and Plan: Sinusitis Bronchitis with bronchospasm dulera levaquin   Signed,  Phillips Odor, MD

## 2013-02-13 NOTE — Patient Instructions (Addendum)
Asthma, Acute Bronchospasm Your exam shows you have asthma, or acute bronchospasm that acts like asthma. Bronchospasm means your air passages become narrowed. These conditions are due to inflammation and airway spasm that cause narrowing of the bronchial tubes in the lungs. This causes you to have wheezing and shortness of breath. POSSIBLE TRIGGERS  Animal dander from the skin, hair, or feathers of animals.  Dust mites contained in house dust.  Cockroaches.  Pollen from trees or grass.  Mold.  Cigarette or tobacco smoke.  Air pollutants such as dust, household cleaners, hair sprays, aerosol sprays, paint fumes, strong chemicals, or strong odors.  Cold air or weather changes. Cold air may cause inflammation. Winds increase molds and pollens in the air.  Strong emotions such as crying or laughing hard.  Stress.  Certain medicines such as aspirin or beta-blockers.  Sulfites in such foods and drinks as dried fruits and wine.  Infections or inflammatory conditions such as a flu, cold, or inflammation of the nasal membranes (rhinitis).  Gastroesophageal reflux disease (GERD). GERD is a condition where stomach acid backs up into your throat (esophagus).  Exercise or strenous activity. TREATMENT  Treatment is aimed at making the narrowed airways larger. Mild asthma or bronchospasm is usually controlled with inhaled medicines. Albuterol is a common medicine that you breathe in to open spastic or narrowed airways. Steroid medicine is also used to reduce the inflammation when an attack is moderate or severe. Antibiotics are only used if a bacterial infection is present.  HOME CARE INSTRUCTIONS   Rest.  Drink plenty of liquids. This helps the mucus to remain thin and easily coughed up. Do not use caffeine or alcohol.  Do not smoke. Avoid being exposed to secondhand smoke.  You play a critical role in keeping yourself in good health. Avoid exposure to things that cause you to wheeze.  Avoid exposure to things that cause you to have breathing problems. Keep your medicines up-to-date and available. Carefully follow your caregiver's treatment plan.  When pollen or pollution is bad, keep windows closed and use an air conditioner or go to places with air conditioning.  Take your medicine exactly as prescribed.  Asthma requires careful medical attention. See your caregiver for follow-up as advised. If you are more than [redacted] weeks pregnant and you were prescribed any new medicines, let your obstetrician know about the visit and how you are doing. Arrange a recheck. SEEK IMMEDIATE MEDICAL CARE IF:   You are getting worse.  You have trouble breathing. If severe, call your local emergency services 911 in [place]  You develop chest pain or discomfort.  You are throwing up or not drinking fluids.  You are coughing up yellow, green, brown, or bloody sputum.  You have a fever or persistent symptoms for more than 2 3 days.  You have a fever and your symptoms suddenly get worse.  You have trouble swallowing. MAKE SURE YOU:   Understand these instructions.  Will watch your condition.  Will get help right away if you are not doing well or get worse. Document Released: 11/01/2006 Document Revised: 07/03/2012 Document Reviewed: 07/01/2007 [employer] Patient Information 2014 [employer].  

## 2013-03-12 ENCOUNTER — Ambulatory Visit: Payer: BC Managed Care – PPO | Admitting: Family Medicine

## 2013-04-02 ENCOUNTER — Telehealth: Payer: Self-pay

## 2013-04-02 NOTE — Telephone Encounter (Signed)
Patient states that he but himself on a diet and has lost 20 lbs in the last month and his blood pressure reading have been really low.  Patient would like to know if he should bring those to an appointment or come in to the walk-in clinic.    Best#: [phone]

## 2013-04-02 NOTE — Telephone Encounter (Signed)
Congratulations on the weight loss. What are the BP readings? He indicates they are 108-112 over 68-73, he has had some light headed feelings occasionally. Please advise, he wants to decrease meds or d/c.

## 2013-04-03 NOTE — Telephone Encounter (Signed)
I counseled pt to discontinue BP medication and monitor BP and heart rate. Resume medication for BP > 140/90 and/or  HR>90. His goal weight = 260 lbs; he is scheduled to return for re-check before end of this year.

## 2013-08-04 ENCOUNTER — Ambulatory Visit: Payer: BC Managed Care – PPO

## 2013-08-04 ENCOUNTER — Ambulatory Visit (INDEPENDENT_AMBULATORY_CARE_PROVIDER_SITE_OTHER): Payer: BC Managed Care – PPO | Admitting: Family Medicine

## 2013-08-04 VITALS — BP 130/76 | HR 91 | Temp 99.0°F | Resp 18 | Ht 73.0 in | Wt 275.8 lb

## 2013-08-04 DIAGNOSIS — R062 Wheezing: Secondary | ICD-10-CM

## 2013-08-04 DIAGNOSIS — R05 Cough: Secondary | ICD-10-CM

## 2013-08-04 DIAGNOSIS — Z9109 Other allergy status, other than to drugs and biological substances: Secondary | ICD-10-CM

## 2013-08-04 DIAGNOSIS — R058 Other specified cough: Secondary | ICD-10-CM

## 2013-08-04 DIAGNOSIS — R059 Cough, unspecified: Secondary | ICD-10-CM

## 2013-08-04 DIAGNOSIS — J209 Acute bronchitis, unspecified: Secondary | ICD-10-CM

## 2013-08-04 MED ORDER — AZITHROMYCIN 250 MG PO TABS: ORAL_TABLET | ORAL | Status: DC

## 2013-08-04 MED ORDER — PREDNISONE 20 MG PO TABS: 20.0000 mg | ORAL_TABLET | Freq: Every day | ORAL | Status: DC

## 2013-08-04 MED ORDER — MONTELUKAST SODIUM 10 MG PO TABS: 10.0000 mg | ORAL_TABLET | Freq: Every day | ORAL | Status: DC

## 2013-08-04 MED ORDER — ALBUTEROL SULFATE (2.5 MG/3ML) 0.083% IN NEBU
2.5000 mg | INHALATION_SOLUTION | Freq: Once | RESPIRATORY_TRACT | Status: AC
  Administered 2013-08-04: 2.5 mg via RESPIRATORY_TRACT

## 2013-08-04 NOTE — Progress Notes (Signed)
Subjective: 43 year old man who has had a cough for little over 3 weeks. He was coughing up more stuff but now it's mostly clear. He feels like it's a little worse on the left side of his lung. He has wheeziness. He has this occasionally, about 3 times in the last 2 years. He does not smoke. He works a Designer, multimedia. He has not felt terribly ill with it, but the cough is frustrating and nagging.  Objective: TMs are normal. Throat clear. Neck supple without nodes. Chest is clear on the right but has wheezes and rhonchi on the left both upper and lower lung fields. Heart regular.  Peak flow was about 540 on best attempt with a predicted maximum around 620  Assessment: Asthmatic bronchitis, rule out pneumonia  Plan: Chest x-ray Treat with a albuterol nebulizer and see if that helps.  [facility] reading (PRIMARY) by  Dr. Linna Darner Normal cxr .  Peak flow really did not improve despite nebulized treatments though he thought he felt a little bit better. He continues to cough. His chest is still queasy on the left.  Assessment: Post viral cough syndrome Seasonal and environmental allergies  Plan: See orders for prescriptions and instructions

## 2013-08-04 NOTE — Patient Instructions (Signed)
Drink plenty of fluids  Use the albuterol 2 inhalations 4 times daily  Continue the Singulair  Take the prednisone in a tapered dose fashion as directed  Take the azithromycin as directed  Return if worse or not improving

## 2015-03-24 ENCOUNTER — Encounter: Payer: Self-pay | Admitting: Physician Assistant

## 2015-03-24 ENCOUNTER — Ambulatory Visit (INDEPENDENT_AMBULATORY_CARE_PROVIDER_SITE_OTHER): Payer: BLUE CROSS/BLUE SHIELD | Admitting: Physician Assistant

## 2015-03-24 VITALS — BP 110/80 | HR 73 | Temp 97.7°F | Resp 18 | Ht 72.5 in | Wt 309.4 lb

## 2015-03-24 DIAGNOSIS — Z9109 Other allergy status, other than to drugs and biological substances: Secondary | ICD-10-CM

## 2015-03-24 DIAGNOSIS — J40 Bronchitis, not specified as acute or chronic: Secondary | ICD-10-CM

## 2015-03-24 DIAGNOSIS — Z91048 Other nonmedicinal substance allergy status: Secondary | ICD-10-CM

## 2015-03-24 DIAGNOSIS — J310 Chronic rhinitis: Secondary | ICD-10-CM | POA: Diagnosis not present

## 2015-03-24 DIAGNOSIS — T485X5A Adverse effect of other anti-common-cold drugs, initial encounter: Secondary | ICD-10-CM

## 2015-03-24 DIAGNOSIS — R05 Cough: Secondary | ICD-10-CM | POA: Diagnosis not present

## 2015-03-24 DIAGNOSIS — I10 Essential (primary) hypertension: Secondary | ICD-10-CM | POA: Diagnosis not present

## 2015-03-24 DIAGNOSIS — R059 Cough, unspecified: Secondary | ICD-10-CM | POA: Insufficient documentation

## 2015-03-24 DIAGNOSIS — R062 Wheezing: Secondary | ICD-10-CM | POA: Diagnosis not present

## 2015-03-24 DIAGNOSIS — L309 Dermatitis, unspecified: Secondary | ICD-10-CM | POA: Insufficient documentation

## 2015-03-24 MED ORDER — ALBUTEROL SULFATE HFA 108 (90 BASE) MCG/ACT IN AERS: 2.0000 | INHALATION_SPRAY | RESPIRATORY_TRACT | Status: DC | PRN

## 2015-03-24 MED ORDER — MONTELUKAST SODIUM 10 MG PO TABS: 10.0000 mg | ORAL_TABLET | Freq: Every day | ORAL | Status: DC

## 2015-03-24 MED ORDER — MOMETASONE FURO-FORMOTEROL FUM 200-5 MCG/ACT IN AERO: 2.0000 | INHALATION_SPRAY | Freq: Two times a day (BID) | RESPIRATORY_TRACT | Status: DC

## 2015-03-24 MED ORDER — AMOXICILLIN-POT CLAVULANATE 875-125 MG PO TABS
1.0000 | ORAL_TABLET | Freq: Two times a day (BID) | ORAL | Status: AC
Start: 2015-03-24 — End: 2015-04-03

## 2015-03-24 MED ORDER — PREDNISONE 20 MG PO TABS: ORAL_TABLET | ORAL | Status: AC

## 2015-03-24 MED ORDER — FLUTICASONE PROPIONATE 50 MCG/ACT NA SUSP: 2.0000 | Freq: Every day | NASAL | Status: DC

## 2015-03-24 MED ORDER — ATENOLOL-CHLORTHALIDONE 50-25 MG PO TABS: 0.5000 | ORAL_TABLET | Freq: Every day | ORAL | Status: DC

## 2015-03-24 NOTE — Progress Notes (Signed)
Patient ID: David Aguilar, male    DOB: 06-15-1971, 44 y.o.   MRN: [id number]  PCP: No primary care provider on file. Previously Dr. Leward Quan, who has retired, though patient not seen by her since 2014.  Subjective:   Chief Complaint  Patient presents with  . Cough    congestion in chest x 5 days  . Wheezing  . Medication Refill    need refill on all meds.    HPI Presents for evaluation of cough and wheezing, and needs medications refilled.  Cough actually began in 09/2014 with an upper respiratory infection. His symptoms all resolved except the cough, which was mild and just annoying until 5 days ago when the other symptoms returned, the cough worsened and he developed wheezing. Feels drainage in the back of the throat. Worse with lying down. No fever, chills. No nausea, vomiting or diarrhea. No myalgias, arthralgias or rash.  Wife with sinusitis. Son with cough and wheezing, recently started nebs.  Always has allergies. Denies history of asthma as a child. Notes wheezing like this 1-2 times/year.  Uses nasal decongestant spray daily since age 23, 4-5 times/day when ill like this. Not using Dulera regularly x several months. Is consistent with Singulair and Flonase. Does not take a daily oral antihistamine.   Review of Systems  Constitutional: Negative for fever, chills and fatigue.  HENT: Positive for congestion, postnasal drip, rhinorrhea and sinus pressure. Negative for dental problem, drooling, ear discharge, ear pain, facial swelling, hearing loss, mouth sores, nosebleeds, sneezing, sore throat, tinnitus, trouble swallowing and voice change.   Eyes: Negative for discharge, itching and visual disturbance.  Respiratory: Positive for cough, chest tightness and wheezing. Negative for apnea, choking, shortness of breath and stridor.   Cardiovascular: Negative for chest pain, palpitations and leg swelling.  Gastrointestinal: Negative for nausea, vomiting and diarrhea.   Genitourinary: Negative for dysuria, urgency, frequency and hematuria.  Musculoskeletal: Negative for myalgias, back pain and arthralgias.  Skin: Negative for rash.  Allergic/Immunologic: Positive for environmental allergies. Negative for food allergies and immunocompromised state.  Neurological: Negative for dizziness, weakness, light-headedness and headaches.       Patient Active Problem List   Diagnosis Date Noted  . Eczema 03/24/2015  . Cough 03/24/2015  . Unspecified essential hypertension 12/13/2012  . Family history of colon cancer 12/13/2012  . Obesity, unspecified 12/12/2012  . Achilles tendonitis 12/12/2012  . Allergic rhinitis 08/16/2012     Prior to Admission medications   Medication Sig Start Date End Date Taking? Authorizing Provider  albuterol (PROVENTIL HFA;VENTOLIN HFA) 108 (90 BASE) MCG/ACT inhaler Inhale 2 puffs into the lungs every 4 (four) hours as needed for wheezing (cough, shortness of breath or wheezing.). 11/12/12  Yes Barton Fanny, MD  atenolol-chlorthalidone (TENORETIC) 50-25 MG per tablet Take 0.5 tablets by mouth daily. 12/12/12  Yes Barton Fanny, MD  clobetasol cream (TEMOVATE) 0.05 % Apply topically 2 (two) times daily. 12/12/12  Yes Barton Fanny, MD  fluticasone [facility]) 50 MCG/ACT nasal spray Place 2 sprays into the nose daily. 08/16/12  Yes Barton Fanny, MD  mometasone-formoterol [facility]) 200-5 MCG/ACT AERO Inhale 2 puffs into the lungs 2 (two) times daily. 02/13/13  Yes Roselee Culver, MD  montelukast (SINGULAIR) 10 MG tablet Take 1 tablet (10 mg total) by mouth at bedtime. 08/04/13  Yes Posey Boyer, MD     No Known Allergies     Objective:  Physical Exam  Constitutional: He is oriented to person, place,  and time. He appears well-developed and well-nourished. He is active and cooperative. No distress.  BP 110/80 mmHg  Pulse 73  Temp(Src) 97.7 F (36.5 C) (Oral)  Resp 18  Ht 6' 0.5" (1.842 m)  Wt 309 lb 6.4  oz (140.343 kg)  BMI 41.36 kg/m2  SpO2 96%  HENT:  Head: Normocephalic and atraumatic.  Right Ear: Hearing and external ear normal.  Left Ear: Hearing and external ear normal.  Nose: Nose normal.  Mouth/Throat: Oropharynx is clear and moist.  Eyes: Conjunctivae and EOM are normal. Pupils are equal, round, and reactive to light. Right eye exhibits no discharge. Left eye exhibits no discharge. No scleral icterus.  Neck: Normal range of motion. Neck supple. No thyromegaly present.  Cardiovascular: Normal rate, regular rhythm and normal heart sounds.   Pulses:      Radial pulses are 2+ on the right side, and 2+ on the left side.  Pulmonary/Chest: Effort normal. No respiratory distress. He has wheezes (audible on forced exhalation from across the room, but not on auscultation of normal breathing). He has no rales. He exhibits no tenderness.  Lymphadenopathy:       Head (right side): No tonsillar, no preauricular, no posterior auricular and no occipital adenopathy present.       Head (left side): No tonsillar, no preauricular, no posterior auricular and no occipital adenopathy present.    He has no cervical adenopathy.       Right: No supraclavicular adenopathy present.       Left: No supraclavicular adenopathy present.  Neurological: He is alert and oriented to person, place, and time. No cranial nerve deficit or sensory deficit.  Skin: Skin is warm, dry and intact. No rash noted. No cyanosis or erythema. Nails show no clubbing.  Psychiatric: He has a normal mood and affect. His speech is normal and behavior is normal.           Assessment & Plan:   1. Cough 2. Wheezing  This may be viral URI illness and RAD, but unclear. Given the duration of symptoms and worsening, elect to cover for bacterial causes. Oral prednisone taper. Increase Dulera to 2 puffs BID. Once use of rescue is minimal, he can reduce to 1 puff BID and then consider 1 puff QD. Continue Flonase and Singulair. He's not a  fan of oral antihistamines, finding them too drying. Reassess in 4 weeks, sooner if symptoms worsen/persist. - amoxicillin-clavulanate (AUGMENTIN) 875-125 MG per tablet; Take 1 tablet by mouth 2 (two) times daily.  Dispense: 20 tablet; Refill: 0 - montelukast (SINGULAIR) 10 MG tablet; Take 1 tablet (10 mg total) by mouth at bedtime.  Dispense: 90 tablet; Refill: 3 - mometasone-formoterol (DULERA) 200-5 MCG/ACT AERO; Inhale 2 puffs into the lungs 2 (two) times daily.  Dispense: 3 Inhaler; Refill: 3 - albuterol (PROVENTIL HFA;VENTOLIN HFA) 108 (90 BASE) MCG/ACT inhaler; Inhale 2 puffs into the lungs every 4 (four) hours as needed for wheezing (cough, shortness of breath or wheezing.).  Dispense: 1 Inhaler; Refill: 2 - predniSONE (DELTASONE) 20 MG tablet; Take 3 PO QAM x3days, 2 PO QAM x3days, 1 PO QAM x3days  Dispense: 18 tablet; Refill: 0  3. Rhinitis medicamentosa STOP nasal decongestant spray. Anticipatory guidance. - predniSONE (DELTASONE) 20 MG tablet; Take 3 PO QAM x3days, 2 PO QAM x3days, 1 PO QAM x3days  Dispense: 18 tablet; Refill: 0  4. Benign essential HTN Controlled. Continue current treatment. - atenolol-chlorthalidone (TENORETIC) 50-25 MG per tablet; Take 0.5 tablets by mouth daily.  Dispense: 45 tablet; Refill: 3  5. Environmental allergies Uncontrolled. See above. reassess in 4 weeks. - montelukast (SINGULAIR) 10 MG tablet; Take 1 tablet (10 mg total) by mouth at bedtime.  Dispense: 90 tablet; Refill: 3 - fluticasone (FLONASE) 50 MCG/ACT nasal spray; Place 2 sprays into both nostrils daily.  Dispense: 48 g; Refill: 3   Return in about 1 month (around 04/24/2015), or if symptoms worsen or fail to improve, for complete physical and fasting labs.   Fara Chute, PA-C Physician Assistant-Certified [facility]

## 2015-03-24 NOTE — Patient Instructions (Signed)
STOP the nasal decongestant spray. It will be difficult for a few days, but then will get better. The oral prednisone will help. You should continue the Flonase as before, and you may add a saline nasal spray (and you can use that as often as you like!).  Once you no longer need the albuterol (Rescue) inhaler regularly, you can try reducing the Dulera to 1 puff twice a day.

## 2015-03-25 ENCOUNTER — Telehealth: Payer: Self-pay | Admitting: Physician Assistant

## 2015-03-25 ENCOUNTER — Encounter: Payer: Self-pay | Admitting: Physician Assistant

## 2015-03-25 NOTE — Telephone Encounter (Signed)
Spoke with patient and gave him the information that David Aguilar respond back with

## 2015-03-25 NOTE — Telephone Encounter (Signed)
Please contact this patient. Thank him for letting me know. It's unlikely that the amoxicillin caused this reaction. However, the ehart rate going faster than is normal for him can feel weird, cause him to feel nervous or jittery. Even though that heart rate is technically normal. I recommend that he check his BP and pulse 1-2 times each day for the next week and let me know if BP consistently >140/90 or heart rate consistently >90.  If at any time he is feeling uncomfortable, he can come in for re-evaluation.

## 2015-03-25 NOTE — Telephone Encounter (Signed)
Patient called to deliver a message to [facility]. He states that he took a combination of medications last night which include the following: Formula One, Magnesium vitamins, 2 Dulera pills, 1 Singular, and he took a 2nd dose of Amoxacillin. He states that late in the evening his heart rate spiked to the mid 90s. This is unusual because he states that his heart rate is normally in the 60s-70s. His blood pressure was also concerning to him. He took half an Atenolol. He checked his blood pressure at 5am and it was 137/90. He checked it about an hour and 30 minutes after that the readings were 126/89 and 119/87. His heart rate slowed to the 70s. While patient was on the phone he stated that his heart rate went back up to 83. He states that physically he feels fine however the symptoms he experience last night and early this morning were alarming to him and he wants to make Chelle aware.    [phone]

## 2015-03-26 NOTE — Telephone Encounter (Signed)
Left message to return call 

## 2015-03-26 NOTE — Telephone Encounter (Signed)
Patient notified. He is doing better and will continue to track BP/Pulse

## 2015-05-18 ENCOUNTER — Ambulatory Visit (INDEPENDENT_AMBULATORY_CARE_PROVIDER_SITE_OTHER): Payer: BLUE CROSS/BLUE SHIELD | Admitting: Physician Assistant

## 2015-05-18 ENCOUNTER — Encounter: Payer: Self-pay | Admitting: Physician Assistant

## 2015-05-18 VITALS — BP 116/80 | HR 72 | Temp 98.0°F | Resp 16 | Ht 73.5 in | Wt 309.0 lb

## 2015-05-18 DIAGNOSIS — Z114 Encounter for screening for human immunodeficiency virus [HIV]: Secondary | ICD-10-CM

## 2015-05-18 DIAGNOSIS — E669 Obesity, unspecified: Secondary | ICD-10-CM

## 2015-05-18 DIAGNOSIS — R7989 Other specified abnormal findings of blood chemistry: Secondary | ICD-10-CM

## 2015-05-18 DIAGNOSIS — I10 Essential (primary) hypertension: Secondary | ICD-10-CM | POA: Diagnosis not present

## 2015-05-18 DIAGNOSIS — E291 Testicular hypofunction: Secondary | ICD-10-CM | POA: Insufficient documentation

## 2015-05-18 DIAGNOSIS — Z0000 Encounter for general adult medical examination without abnormal findings: Secondary | ICD-10-CM | POA: Diagnosis not present

## 2015-05-18 LAB — COMPREHENSIVE METABOLIC PANEL
ALT: 48 U/L — ABNORMAL HIGH (ref 9–46)
AST: 38 U/L (ref 10–40)
Albumin: 4.1 g/dL (ref 3.6–5.1)
Alkaline Phosphatase: 77 U/L (ref 40–115)
BUN: 15 mg/dL (ref 7–25)
CO2: 30 mmol/L (ref 20–31)
Calcium: 9.4 mg/dL (ref 8.6–10.3)
Chloride: 98 mmol/L (ref 98–110)
Creat: 0.7 mg/dL (ref 0.60–1.35)
Glucose, Bld: 83 mg/dL (ref 65–99)
Potassium: 3.9 mmol/L (ref 3.5–5.3)
Sodium: 136 mmol/L (ref 135–146)
Total Bilirubin: 0.5 mg/dL (ref 0.2–1.2)
Total Protein: 7.4 g/dL (ref 6.1–8.1)

## 2015-05-18 LAB — CBC WITH DIFFERENTIAL/PLATELET
Basophils Absolute: 0.1 10*3/uL (ref 0.0–0.1)
Basophils Relative: 1 % (ref 0–1)
Eosinophils Absolute: 0.3 10*3/uL (ref 0.0–0.7)
Eosinophils Relative: 3 % (ref 0–5)
HCT: 38.4 % — ABNORMAL LOW (ref 39.0–52.0)
Hemoglobin: 13.1 g/dL (ref 13.0–17.0)
Lymphocytes Relative: 28 % (ref 12–46)
Lymphs Abs: 2.9 10*3/uL (ref 0.7–4.0)
MCH: 29 pg (ref 26.0–34.0)
MCHC: 34.1 g/dL (ref 30.0–36.0)
MCV: 85.1 fL (ref 78.0–100.0)
MPV: 9.8 fL (ref 8.6–12.4)
Monocytes Absolute: 0.9 10*3/uL (ref 0.1–1.0)
Monocytes Relative: 9 % (ref 3–12)
Neutro Abs: 6 10*3/uL (ref 1.7–7.7)
Neutrophils Relative %: 59 % (ref 43–77)
Platelets: 291 10*3/uL (ref 150–400)
RBC: 4.51 MIL/uL (ref 4.22–5.81)
RDW: 14.3 % (ref 11.5–15.5)
WBC: 10.2 10*3/uL (ref 4.0–10.5)

## 2015-05-18 LAB — HIV ANTIBODY (ROUTINE TESTING W REFLEX): HIV 1&2 Ab, 4th Generation: NONREACTIVE

## 2015-05-18 LAB — TSH: TSH: 0.893 u[IU]/mL (ref 0.350–4.500)

## 2015-05-18 LAB — TESTOSTERONE: Testosterone: 21 ng/dL — ABNORMAL LOW (ref 300–890)
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
## 2015-05-18 NOTE — Addendum Note (Signed)
Addended by: Fara Chute on: 05/18/2015 05:25 PM   Modules accepted: Orders

## 2015-05-18 NOTE — Progress Notes (Signed)
Subjective:    Patient ID: David Aguilar, male    DOB: August 30, 1970, 44 y.o.   MRN: [id number]  HPI Patient presents today for annual visit. Was last seen on 03/24/15 for cough and wheezing, which has since resolved. Still taking Singulair 10 mg daily and Dulera 200-5 mcg/act, but only 2 puffs once a day as opposed to BID, with good symptom relief. Patient endorses mild cough today but attributes this to his 45 yo and 45 yo twins' recent cough. HTN continues to be well-controlled, checks BP at home occasionally, numbers have been running in 118-124/mid 70s range. Recently started a new diet plan, [employer] Healthy Weight, which he has tried in the past with good results (approx. 20-30 lb weight loss). Received annual flu vaccine on 05/13/15. Patient requests testosterone level check. No other concerns on today's visit.   Patient Active Problem List   Diagnosis Date Noted  . Eczema 03/24/2015  . Cough 03/24/2015  . Essential hypertension 12/13/2012  . Family history of colon cancer 12/13/2012  . Obesity, unspecified 12/12/2012  . Achilles tendonitis 12/12/2012  . Allergic rhinitis 08/16/2012   FH and SH were reviewed, no new changes.   Prior to Admission medications   Medication Sig Start Date End Date Taking? Authorizing Provider  albuterol (PROVENTIL HFA;VENTOLIN HFA) 108 (90 BASE) MCG/ACT inhaler Inhale 2 puffs into the lungs every 4 (four) hours as needed for wheezing (cough, shortness of breath or wheezing.). 03/24/15  Yes Chelle Jeffery, PA-C  atenolol-chlorthalidone (TENORETIC) 50-25 MG per tablet Take 0.5 tablets by mouth daily. 03/24/15  Yes Chelle Jeffery, PA-C  clobetasol cream (TEMOVATE) 0.05 % Apply topically 2 (two) times daily. 12/12/12  Yes Barton Fanny, MD  mometasone-formoterol [facility]) 200-5 MCG/ACT AERO Inhale 2 puffs into the lungs 2 (two) times daily. 03/24/15  Yes Chelle Jeffery, PA-C  montelukast (SINGULAIR) 10 MG tablet Take 1 tablet (10 mg total) by mouth at  bedtime. 03/24/15  Yes Chelle Jeffery, PA-C  fluticasone (FLONASE) 50 MCG/ACT nasal spray Place 2 sprays into both nostrils daily. Patient not taking: Reported on 05/18/2015 03/24/15   Harrison Mons, PA-C   No Known Allergies   Social History   Social History  . Marital Status: Married    Spouse Name: Altha Harm  . Number of Children: 3  . Years of Education: College   Occupational History  . Radio producer   Social History Main Topics  . Smoking status: Never Smoker   . Smokeless tobacco: Never Used  . Alcohol Use: 0.0 - 0.6 oz/week    0-1 Glasses of wine per week  . Drug Use: No  . Sexual Activity:    Partners: Female     Comment: married   Other Topics Concern  . Not on file   Social History Narrative   Lives with his wife and their 3 children.   Family lives in [place], [place].   In-laws live in [place], [place]   Family History  Problem Relation Age of Onset  . Hypertension Mother   . Hypertension Father   . Cancer Father     stage 1 colon cancer  . Hyperlipidemia Father   . Hypertension Brother   . Heart disease Maternal Grandfather   . Hyperlipidemia Maternal Grandfather   . Hypertension Maternal Grandfather   . Hypertension Paternal Grandmother   . Stroke Paternal Grandmother   . Heart disease Paternal Grandfather   . Hyperlipidemia Paternal Grandfather   . Hypertension Paternal  Grandfather     Review of Systems  Constitutional: Negative for fever and fatigue.  HENT: Negative.   Eyes: Negative.   Respiratory: Positive for cough. Negative for wheezing.   Cardiovascular: Negative for chest pain, palpitations and leg swelling.  Gastrointestinal: Negative for abdominal pain, diarrhea, constipation and blood in stool.  Endocrine: Negative for polyuria.  Genitourinary: Negative for dysuria and urgency.  Musculoskeletal: Negative for myalgias, back pain, joint swelling and arthralgias.  Skin: Negative.   Allergic/Immunologic:  Positive for environmental allergies.  Neurological: Negative for dizziness, weakness, light-headedness, numbness and headaches.  Psychiatric/Behavioral: Negative.       Objective:   Physical Exam  Constitutional: He is oriented to person, place, and time. He appears well-developed and well-nourished. No distress.  BP 116/80 mmHg  Pulse 72  Temp(Src) 98 F (36.7 C) (Oral)  Resp 16  Ht 6' 1.5" (1.867 m)  Wt 309 lb (140.161 kg)  BMI 40.21 kg/m2  HENT:  Head: Normocephalic and atraumatic.  Eyes: Conjunctivae and EOM are normal. No scleral icterus.  Neck: Neck supple. No JVD present. No thyromegaly present.  Cardiovascular: Normal rate, regular rhythm, normal heart sounds and intact distal pulses.  Exam reveals no gallop and no friction rub.   No murmur heard. Pulmonary/Chest: Effort normal and breath sounds normal. No respiratory distress. He has no wheezes. He has no rales. He exhibits no tenderness.  Abdominal: Soft. Bowel sounds are normal. He exhibits no distension and no mass. There is no tenderness.  Musculoskeletal: He exhibits no edema or tenderness.  Lymphadenopathy:    He has no cervical adenopathy.  Neurological: He is alert and oriented to person, place, and time.  Skin: Skin is warm and dry. No rash noted. No erythema.  Psychiatric: He has a normal mood and affect. His behavior is normal.  Vitals reviewed.     Assessment & Plan:   1. Essential hypertension - Continue atenolol-chlorthalidone 50-25 mg 0.5 tablet daily.  - Encourage daily exercise and healthy diet to maintain good BP control. - CBC with Differential/Platelet - TSH - Comprehensive metabolic panel  2. Obesity, unspecified - Encourage regular exercise and healthy eating with current diet plan.   3. Screening for HIV (human immunodeficiency virus) - HIV antibody  4. Low testosterone - Testosterone level was 41 in 11/2012. Will repeat level today. If below 50 again, refer to endocrinology for  possible MRI of brain to evaluate for pituitary gland abnormalities and need for possible testosterone supplementation.   Return in 1 year for annual visit or sooner if any problems arise.

## 2015-05-18 NOTE — Patient Instructions (Signed)
I will contact you with your lab results as soon as they are available.   If you have not heard from me in 2 weeks, please contact me.  The fastest way to get your results is to register for My Chart (see the instructions on the last page of this printout).  Keeping you healthy  Get these tests  Blood pressure- Have your blood pressure checked once a year by your healthcare provider.  Normal blood pressure is 120/80.  Weight- Have your body mass index (BMI) calculated to screen for obesity.  BMI is a measure of body fat based on height and weight. You can also calculate your own BMI at www.nhlbisupport.com/bmi/.  Cholesterol- Have your cholesterol checked regularly starting at age 35, sooner may be necessary if you have diabetes, high blood pressure, if a family member developed heart diseases at an early age or if you smoke.   Chlamydia, HIV, and other sexual transmitted disease- Get screened each year until the age of 25 then within three months of each new sexual partner.  Diabetes- Have your blood sugar checked regularly if you have high blood pressure, high cholesterol, a family history of diabetes or if you are overweight.  Get these vaccines  Flu shot- Every fall.  Tetanus shot- Every 10 years.  Menactra- Single dose; prevents meningitis.  Take these steps  Don't smoke- If you do smoke, ask your healthcare provider about quitting. For tips on how to quit, go to www.smokefree.gov or call 1-800-QUIT-NOW.  Be physically active- Exercise 5 days a week for at least 30 minutes.  If you are not already physically active start slow and gradually work up to 30 minutes of moderate physical activity.  Examples of moderate activity include walking briskly, mowing the yard, dancing, swimming bicycling, etc.  Eat a healthy diet- Eat a variety of healthy foods such as fruits, vegetables, low fat milk, low fat cheese, yogurt, lean meats, poultry, fish, beans, tofu, etc.  For more information  on healthy eating, go to www.thenutritionsource.org  Drink alcohol in moderation- Limit alcohol intake two drinks or less a day.  Never drink and drive.  Dentist- Brush and floss teeth twice daily; visit your dentis twice a year.  Depression-Your emotional health is as important as your physical health.  If you're feeling down, losing interest in things you normally enjoy please talk with your healthcare provider.  Gun Safety- If you keep a gun in your home, keep it unloaded and with the safety lock on.  Bullets should be stored separately.  Helmet use- Always wear a helmet when riding a motorcycle, bicycle, rollerblading or skateboarding.  Safe sex- If you may be exposed to a sexually transmitted infection, use a condom  Seat belts- Seat bels can save your life; always wear one.  Smoke/Carbon Monoxide detectors- These detectors need to be installed on the appropriate level of your home.  Replace batteries at least once a year.  Skin Cancer- When out in the sun, cover up and use sunscreen SPF 15 or higher.  Violence- If anyone is threatening or hurting you, please tell your healthcare provider. 

## 2015-05-21 ENCOUNTER — Encounter: Payer: Self-pay | Admitting: Physician Assistant

## 2015-05-24 ENCOUNTER — Telehealth: Payer: Self-pay

## 2015-05-24 NOTE — Telephone Encounter (Signed)
Advised patient to come in to be seen for recurrent wheezing.

## 2015-05-24 NOTE — Telephone Encounter (Signed)
Pt is calling to request an Rx for his persistent cough.  He said that Chelle prescribed an rx for this same issue in the past and he said whatever she prescribed really helped. He also sent and email via mychart on 05/21/15. It reads:  Chelle - can you please order me a prescription for my cough. My cough has gone on now since the 15th and I have started the wheezing again - starting the evening of the 18th (my physical). Not sure if you can do this or not but I though I would ask and maybe nip it. I can clear my throat but it just will not go away. Thanks in advance.

## 2015-05-24 NOTE — Telephone Encounter (Signed)
Reviewed My Chart message and responded to patient in My Chart, advising him to come in for evaluation of cough and wheezing.

## 2015-05-25 ENCOUNTER — Ambulatory Visit (INDEPENDENT_AMBULATORY_CARE_PROVIDER_SITE_OTHER): Payer: BLUE CROSS/BLUE SHIELD | Admitting: Family Medicine

## 2015-05-25 VITALS — BP 122/80 | HR 80 | Temp 98.1°F | Resp 18 | Ht 73.0 in | Wt 309.0 lb

## 2015-05-25 DIAGNOSIS — J209 Acute bronchitis, unspecified: Secondary | ICD-10-CM | POA: Diagnosis not present

## 2015-05-25 MED ORDER — HYDROCOD POLST-CPM POLST ER 10-8 MG/5ML PO SUER: 5.0000 mL | Freq: Every evening | ORAL | Status: DC | PRN

## 2015-05-25 MED ORDER — PREDNISONE 20 MG PO TABS: ORAL_TABLET | ORAL | Status: DC

## 2015-05-25 MED ORDER — AZITHROMYCIN 250 MG PO TABS: ORAL_TABLET | ORAL | Status: DC

## 2015-05-25 NOTE — Patient Instructions (Signed)
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
## 2015-06-02 ENCOUNTER — Telehealth: Payer: Self-pay | Admitting: Physician Assistant

## 2015-06-02 DIAGNOSIS — Z13220 Encounter for screening for lipoid disorders: Secondary | ICD-10-CM

## 2015-06-02 NOTE — Telephone Encounter (Signed)
Patient is inquiring about an MRI that is supposed to be scheduled for him. He has questions concerning his insurance.   [phone]

## 2015-06-02 NOTE — Telephone Encounter (Signed)
I believe I spoke to the patient after the MRI request was submitted and told him that [facility] does not allow [place] to do authorizations online or by phone.  I faxed the documentation and the order to [employer], the reviewing body for [employer], on 05/20/15.  The process can take up to two weeks.  We are still waiting on the result of the request.

## 2015-06-03 NOTE — Telephone Encounter (Signed)
Spoke with pt, advised message. He would like to know why a lipid panel was not ordered with his physical. Can he come in to have this done, blood draw only? Chelle I also checked on his endocrinology referral and Dr. Rutherford Guys did not receive it. Melanie sent him to [facility] instead so I will get his appt since he has been waiting since 10/19.

## 2015-06-04 ENCOUNTER — Other Ambulatory Visit: Payer: Self-pay

## 2015-06-04 DIAGNOSIS — R7989 Other specified abnormal findings of blood chemistry: Secondary | ICD-10-CM

## 2015-06-04 NOTE — Telephone Encounter (Signed)
I apparently did not order the lipid panel as I had intended. I am VERY sorry.  Yes, he may come in to have that drawn, Lab only, NO CHARGE OV. Order placed.  Thank you for taking care of the referral.  Orders Placed This Encounter  Procedures  . Lipid panel    Standing Status: Future     Number of Occurrences:      Standing Expiration Date: 06/03/2016    Order Specific Question:  Has the patient fasted?    Answer:  No

## 2015-06-04 NOTE — Telephone Encounter (Signed)
Spoke to pt and informed him. He will come Monday morning to have his blood drawn.

## 2015-06-16 ENCOUNTER — Other Ambulatory Visit: Payer: Self-pay

## 2015-06-17 ENCOUNTER — Ambulatory Visit: Payer: Self-pay | Admitting: Endocrinology

## 2015-06-18 ENCOUNTER — Ambulatory Visit
Admission: RE | Admit: 2015-06-18 | Discharge: 2015-06-18 | Disposition: A | Discharge status: Home / Self Care | Payer: BLUE CROSS/BLUE SHIELD | Source: Ambulatory Visit | Attending: Physician Assistant | Admitting: Physician Assistant

## 2015-06-18 DIAGNOSIS — R7989 Other specified abnormal findings of blood chemistry: Secondary | ICD-10-CM

## 2015-06-18 MED ORDER — GADOBENATE DIMEGLUMINE 529 MG/ML IV SOLN
10.0000 mL | Freq: Once | INTRAVENOUS | Status: AC | PRN
Start: 2015-06-18 — End: 2015-06-18
  Administered 2015-06-18: 10 mL via INTRAVENOUS

## 2015-06-19 ENCOUNTER — Other Ambulatory Visit: Payer: Self-pay | Admitting: Physician Assistant

## 2015-06-19 DIAGNOSIS — D352 Benign neoplasm of pituitary gland: Secondary | ICD-10-CM

## 2015-06-19 DIAGNOSIS — E349 Endocrine disorder, unspecified: Secondary | ICD-10-CM

## 2015-07-21 DIAGNOSIS — D497 Neoplasm of unspecified behavior of endocrine glands and other parts of nervous system: Secondary | ICD-10-CM

## 2015-07-21 HISTORY — DX: Neoplasm of unspecified behavior of endocrine glands and other parts of nervous system: D49.7

## 2015-08-20 ENCOUNTER — Encounter: Payer: Self-pay | Admitting: Physician Assistant

## 2015-08-20 DIAGNOSIS — E221 Hyperprolactinemia: Secondary | ICD-10-CM | POA: Insufficient documentation

## 2015-08-20 DIAGNOSIS — D443 Neoplasm of uncertain behavior of pituitary gland: Secondary | ICD-10-CM | POA: Insufficient documentation

## 2015-10-12 LAB — PSA: PSA: <0.1

## 2015-11-04 DIAGNOSIS — F4323 Adjustment disorder with mixed anxiety and depressed mood: Secondary | ICD-10-CM | POA: Diagnosis not present

## 2015-11-22 DIAGNOSIS — F4323 Adjustment disorder with mixed anxiety and depressed mood: Secondary | ICD-10-CM | POA: Diagnosis not present

## 2015-11-24 DIAGNOSIS — E291 Testicular hypofunction: Secondary | ICD-10-CM | POA: Diagnosis not present

## 2015-11-24 DIAGNOSIS — Z0000 Encounter for general adult medical examination without abnormal findings: Secondary | ICD-10-CM | POA: Diagnosis not present

## 2015-12-31 ENCOUNTER — Ambulatory Visit: Payer: BLUE CROSS/BLUE SHIELD | Admitting: Family Medicine

## 2016-01-03 ENCOUNTER — Ambulatory Visit (INDEPENDENT_AMBULATORY_CARE_PROVIDER_SITE_OTHER): Payer: BLUE CROSS/BLUE SHIELD | Admitting: Family Medicine

## 2016-01-03 ENCOUNTER — Encounter: Payer: Self-pay | Admitting: Family Medicine

## 2016-01-03 VITALS — BP 122/85 | HR 76 | Ht 72.5 in | Wt 315.7 lb

## 2016-01-03 DIAGNOSIS — R7989 Other specified abnormal findings of blood chemistry: Secondary | ICD-10-CM

## 2016-01-03 DIAGNOSIS — J302 Other seasonal allergic rhinitis: Secondary | ICD-10-CM

## 2016-01-03 DIAGNOSIS — I10 Essential (primary) hypertension: Secondary | ICD-10-CM | POA: Diagnosis not present

## 2016-01-03 DIAGNOSIS — E291 Testicular hypofunction: Secondary | ICD-10-CM

## 2016-01-03 DIAGNOSIS — J4520 Mild intermittent asthma, uncomplicated: Secondary | ICD-10-CM

## 2016-01-03 DIAGNOSIS — J45909 Unspecified asthma, uncomplicated: Secondary | ICD-10-CM | POA: Insufficient documentation

## 2016-01-03 DIAGNOSIS — E669 Obesity, unspecified: Secondary | ICD-10-CM

## 2016-01-03 DIAGNOSIS — Z800 Family history of malignant neoplasm of digestive organs: Secondary | ICD-10-CM

## 2016-01-03 NOTE — Assessment & Plan Note (Signed)
Patient admits to using Affrin nasal spray nightly and has done so for many years. 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 He understands the risks associated with this medicine.    Continue Flonase
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
## 2016-01-03 NOTE — Assessment & Plan Note (Addendum)
Patient had used [employer] weight loss systems in the past in the fall 2015 and in 6 weeks lost 31 pounds. Patient will return to clinic and we will discuss weight loss meds when he is ready.  I will check about phenteramine with Dr Chalmers Cater: [facility] ([facility] [address] [place], [place] [address].   I called and left message for them to call me regarding this.  [phone]

## 2016-01-03 NOTE — Assessment & Plan Note (Signed)
Well-controlled continue medications as written.

## 2016-01-03 NOTE — Assessment & Plan Note (Signed)
Patient has never officially been diagnosed with asthma he said but is on medication for reactive airway disease. Rarely ever has to use his albuterol(less than once a month).

## 2016-01-03 NOTE — Patient Instructions (Addendum)
- make f/up appt for fasting labs near future-= cbc, cmp, tsh, a1c, flp, vit d - I will call Dr Chalmers Cater re: phenteramine and see if any C/I. waiting to hear back from them. - plan to RTC when ready to start wt loss program.        Phentermine tablets or capsules What is this medicine? PHENTERMINE (FEN ter meen) decreases your appetite. It is used with a reduced calorie diet and exercise to help you lose weight. This medicine may be used for other purposes; ask your health care provider or pharmacist if you have questions. What should I tell my health care provider before I take this medicine? They need to know if you have any of these conditions: -agitation -glaucoma -heart disease -high blood pressure -history of substance abuse -lung disease called Primary Pulmonary Hypertension (PPH) -taken an MAOI like Carbex, Eldepryl, Marplan, Nardil, or Parnate in last 14 days -thyroid disease -an unusual or allergic reaction to phentermine, other medicines, foods, dyes, or preservatives -pregnant or trying to get pregnant -breast-feeding How should I use this medicine? Take this medicine by mouth with a glass of water. Follow the directions on the prescription label. This medicine is usually taken 30 minutes before or 1 to 2 hours after breakfast. Avoid taking this medicine in the evening. It may interfere with sleep. Take your doses at regular intervals. Do not take your medicine more often than directed. Talk to your pediatrician regarding the use of this medicine in children. Special care may be needed. Overdosage: If you think you have taken too much of this medicine contact a poison control center or emergency room at once. NOTE: This medicine is only for you. Do not share this medicine with others. What if I miss a dose? If you miss a dose, take it as soon as you can. If it is almost time for your next dose, take only that dose. Do not take double or extra doses. What may interact with this  medicine? Do not take this medicine with any of the following medications: -duloxetine -MAOIs like Carbex, Eldepryl, Marplan, Nardil, and Parnate -medicines for colds or breathing difficulties like pseudoephedrine or phenylephrine -procarbazine -sibutramine -SSRIs like citalopram, escitalopram, fluoxetine, fluvoxamine, paroxetine, and sertraline -stimulants like dexmethylphenidate, methylphenidate or modafinil -venlafaxine This medicine may also interact with the following medications: -medicines for diabetes This list may not describe all possible interactions. Give your health care provider a list of all the medicines, herbs, non-prescription drugs, or dietary supplements you use. Also tell them if you smoke, drink alcohol, or use illegal drugs. Some items may interact with your medicine. What should I watch for while using this medicine? Notify your physician immediately if you become short of breath while doing your normal activities. Do not take this medicine within 6 hours of bedtime. It can keep you from getting to sleep. Avoid drinks that contain caffeine and try to stick to a regular bedtime every night. This medicine was intended to be used in addition to a healthy diet and exercise. The best results are achieved this way. This medicine is only indicated for short-term use. Eventually your weight loss may level out. At that point, the drug will only help you maintain your new weight. Do not increase or in any way change your dose without consulting your doctor. You may get drowsy or dizzy. Do not drive, use machinery, or do anything that needs mental alertness until you know how this medicine affects you. Do not stand or  sit up quickly, especially if you are an older patient. This reduces the risk of dizzy or fainting spells. Alcohol may increase dizziness and drowsiness. Avoid alcoholic drinks. What side effects may I notice from receiving this medicine? Side effects that you should  report to your doctor or health care professional as soon as possible: -chest pain, palpitations -depression or severe changes in mood -increased blood pressure -irritability -nervousness or restlessness -severe dizziness -shortness of breath -problems urinating -unusual swelling of the legs -vomiting Side effects that usually do not require medical attention (report to your doctor or health care professional if they continue or are bothersome): -blurred vision or other eye problems -changes in sexual ability or desire -constipation or diarrhea -difficulty sleeping -dry mouth or unpleasant taste -headache -nausea This list may not describe all possible side effects. Call your doctor for medical advice about side effects. You may report side effects to FDA at 1-800-FDA-1088. Where should I keep my medicine? Keep out of the reach of children. This medicine can be abused. Keep your medicine in a safe place to protect it from theft. Do not share this medicine with anyone. Selling or giving away this medicine is dangerous and against the law. This medicine may cause accidental overdose and death if taken by other adults, children, or pets. Mix any unused medicine with a substance like cat litter or coffee grounds. Then throw the medicine away in a sealed container like a sealed bag or a coffee can with a lid. Do not use the medicine after the expiration date. Store at room temperature between 20 and 25 degrees C (68 and 77 degrees F). Keep container tightly closed. NOTE: This sheet is a summary. It may not cover all possible information. If you have questions about this medicine, talk to your doctor, pharmacist, or health care provider.    2016, Elsevier/Gold Standard. (2014-04-07 16:19:53)   Mediterranean Diet  Why follow it? Research shows. . Those who follow the Mediterranean diet have a reduced risk of heart disease  . The diet is associated with a reduced incidence of Parkinson's  and Alzheimer's diseases . People following the diet may have longer life expectancies and lower rates of chronic diseases  . The Dietary Guidelines for Americans recommends the Mediterranean diet as an eating plan to promote health and prevent disease  What Is the Mediterranean Diet?  . Healthy eating plan based on typical foods and recipes of Mediterranean-style cooking . The diet is primarily a plant based diet; these foods should make up a majority of meals   Starches - Plant based foods should make up a majority of meals - They are an important sources of vitamins, minerals, energy, antioxidants, and fiber - Choose whole grains, foods high in fiber and minimally processed items  - Typical grain sources include wheat, oats, barley, corn, brown rice, bulgar, farro, millet, polenta, couscous  - Various types of beans include chickpeas, lentils, fava beans, black beans, white beans   Fruits  Veggies - Large quantities of antioxidant rich fruits & veggies; 6 or more servings  - Vegetables can be eaten raw or lightly drizzled with oil and cooked  - Vegetables common to the traditional Mediterranean Diet include: artichokes, arugula, beets, broccoli, brussel sprouts, cabbage, carrots, celery, collard greens, cucumbers, eggplant, kale, leeks, lemons, lettuce, mushrooms, okra, onions, peas, peppers, potatoes, pumpkin, radishes, rutabaga, shallots, spinach, sweet potatoes, turnips, zucchini - Fruits common to the Mediterranean Diet include: apples, apricots, avocados, cherries, clementines, dates, figs, grapefruits, grapes, melons,  nectarines, oranges, peaches, pears, pomegranates, strawberries, tangerines  Fats - Replace butter and margarine with healthy oils, such as olive oil, canola oil, and tahini  - Limit nuts to no more than a handful a day  - Nuts include walnuts, almonds, pecans, pistachios, pine nuts  - Limit or avoid candied, honey roasted or heavily salted nuts - Olives are central to the  Mediterranean diet - can be eaten whole or used in a variety of dishes   Meats Protein - Limiting red meat: no more than a few times a month - When eating red meat: choose lean cuts and keep the portion to the size of deck of cards - Eggs: approx. 0 to 4 times a week  - Fish and lean poultry: at least 2 a week  - Healthy protein sources include, chicken, [place], lean beef, lamb - Increase intake of seafood such as tuna, salmon, trout, mackerel, shrimp, scallops - Avoid or limit high fat processed meats such as sausage and bacon  Dairy - Include moderate amounts of low fat dairy products  - Focus on healthy dairy such as fat free yogurt, skim milk, low or reduced fat cheese - Limit dairy products higher in fat such as whole or 2% milk, cheese, ice cream  Alcohol - Moderate amounts of red wine is ok  - No more than 5 oz daily for women (all ages) and men older than age 24  - No more than 10 oz of wine daily for men younger than 21  Other - Limit sweets and other desserts  - Use herbs and spices instead of salt to flavor foods  - Herbs and spices common to the traditional Mediterranean Diet include: basil, bay leaves, chives, cloves, cumin, fennel, garlic, lavender, marjoram, mint, oregano, parsley, pepper, rosemary, sage, savory, sumac, tarragon, thyme   It's not just a diet, it's a lifestyle:  . The Mediterranean diet includes lifestyle factors typical of those in the region  . Foods, drinks and meals are best eaten with others and savored . Daily physical activity is important for overall good health . This could be strenuous exercise like running and aerobics . This could also be more leisurely activities such as walking, housework, yard-work, or taking the stairs . Moderation is the key; a balanced and healthy diet accommodates most foods and drinks . Consider portion sizes and frequency of consumption of certain foods   Meal Ideas & Options:  . Breakfast:  o Whole wheat toast or whole  wheat English muffins with peanut butter & hard boiled egg o Steel cut oats topped with apples & cinnamon and skim milk  o Fresh fruit: banana, strawberries, melon, berries, peaches  o Smoothies: strawberries, bananas, greek yogurt, peanut butter o Low fat greek yogurt with blueberries and granola  o Egg white omelet with spinach and mushrooms o Breakfast couscous: whole wheat couscous, apricots, skim milk, cranberries  . Sandwiches:  o Hummus and grilled vegetables (peppers, zucchini, squash) on whole wheat bread   o Grilled chicken on whole wheat pita with lettuce, tomatoes, cucumbers or tzatziki  o Tuna salad on whole wheat bread: tuna salad made with greek yogurt, olives, red peppers, capers, green onions o Garlic rosemary lamb pita: lamb sauted with garlic, rosemary, salt & pepper; add lettuce, cucumber, greek yogurt to pita - flavor with lemon juice and black pepper  . Seafood:  o Mediterranean grilled salmon, seasoned with garlic, basil, parsley, lemon juice and black pepper o Shrimp, lemon, and spinach whole-grain  pasta salad made with low fat greek yogurt  o Seared scallops with lemon orzo  o Seared tuna steaks seasoned salt, pepper, coriander topped with tomato mixture of olives, tomatoes, olive oil, minced garlic, parsley, green onions and cappers  . Meats:  o Herbed greek chicken salad with kalamata olives, cucumber, feta  o Red bell peppers stuffed with spinach, bulgur, lean ground beef (or lentils) & topped with feta   o Kebabs: skewers of chicken, tomatoes, onions, zucchini, squash  o [place] burgers: made with red onions, mint, dill, lemon juice, feta cheese topped with roasted red peppers . Vegetarian o Cucumber salad: cucumbers, artichoke hearts, celery, red onion, feta cheese, tossed in olive oil & lemon juice  o Hummus and whole grain pita points with a greek salad (lettuce, tomato, feta, olives, cucumbers, red onion) o Lentil soup with celery, carrots made with vegetable  broth, garlic, salt and pepper  o Tabouli salad: parsley, bulgur, mint, scallions, cucumbers, tomato, radishes, lemon juice, olive oil, salt and pepper.  Heart-Healthy Eating Plan Many factors influence your heart health, including eating and exercise habits. Heart (coronary) risk increases with abnormal blood fat (lipid) levels. Heart-healthy meal planning includes limiting unhealthy fats, increasing healthy fats, and making other small dietary changes. This includes maintaining a healthy body weight to help keep lipid levels within a normal range. WHAT IS MY PLAN?  Your health care provider recommends that you:  Get no more than _________% of the total calories in your daily diet from fat.  Limit your intake of saturated fat to less than _________% of your total calories each day.  Limit the amount of cholesterol in your diet to less than _________ mg per day. WHAT TYPES OF FAT SHOULD I CHOOSE?  Choose healthy fats more often. Choose monounsaturated and polyunsaturated fats, such as olive oil and canola oil, flaxseeds, walnuts, almonds, and seeds.  Eat more omega-3 fats. Good choices include salmon, mackerel, sardines, tuna, flaxseed oil, and ground flaxseeds. Aim to eat fish at least two times each week.  Limit saturated fats. Saturated fats are primarily found in animal products, such as meats, butter, and cream. Plant sources of saturated fats include palm oil, palm kernel oil, and coconut oil.  Avoid foods with partially hydrogenated oils in them. These contain trans fats. Examples of foods that contain trans fats are stick margarine, some tub margarines, cookies, crackers, and other baked goods. WHAT GENERAL GUIDELINES DO I NEED TO FOLLOW?  Check food labels carefully to identify foods with trans fats or high amounts of saturated fat.  Fill one half of your plate with vegetables and green salads. Eat 4-5 servings of vegetables per day. A serving of vegetables equals 1 cup of raw  leafy vegetables,  cup of raw or cooked cut-up vegetables, or  cup of vegetable juice.  Fill one fourth of your plate with whole grains. Look for the word "whole" as the first word in the ingredient list.  Fill one fourth of your plate with lean protein foods.  Eat 4-5 servings of fruit per day. A serving of fruit equals one medium whole fruit,  cup of dried fruit,  cup of fresh, frozen, or canned fruit, or  cup of 100% fruit juice.  Eat more foods that contain soluble fiber. Examples of foods that contain this type of fiber are apples, broccoli, carrots, beans, peas, and barley. Aim to get 20-30 g of fiber per day.  Eat more home-cooked food and less restaurant, buffet, and fast food.  Limit or avoid alcohol.  Limit foods that are high in starch and sugar.  Avoid fried foods.  Cook foods by using methods other than frying. Baking, boiling, grilling, and broiling are all great options. Other fat-reducing suggestions include:  Removing the skin from poultry.  Removing all visible fats from meats.  Skimming the fat off of stews, soups, and gravies before serving them.  Steaming vegetables in water or broth.  Lose weight if you are overweight. Losing just 5-10% of your initial body weight can help your overall health and prevent diseases such as diabetes and heart disease.  Increase your consumption of nuts, legumes, and seeds to 4-5 servings per week. One serving of dried beans or legumes equals  cup after being cooked, one serving of nuts equals 1 ounces, and one serving of seeds equals  ounce or 1 tablespoon.  You may need to monitor your salt (sodium) intake, especially if you have high blood pressure. Talk with your health care provider or dietitian to get more information about reducing sodium. WHAT FOODS CAN I EAT? Grains Breads, including [place], white, pita, wheat, raisin, rye, oatmeal, and [place]. Tortillas that are neither fried nor made with lard or trans fat.  Low-fat rolls, including hotdog and hamburger buns and English muffins. Biscuits. Muffins. Waffles. Pancakes. Light popcorn. Whole-grain cereals. Flatbread. Melba toast. Pretzels. Breadsticks. Rusks. Low-fat snacks and crackers, including oyster, saltine, matzo, graham, animal, and rye. Rice and pasta, including brown rice and those that are made with whole wheat. Vegetables All vegetables. Fruits All fruits, but limit coconut. Meats and Other Protein Sources Lean, well-trimmed beef, veal, pork, and lamb. Chicken and [place] without skin. All fish and shellfish. Wild duck, rabbit, pheasant, and venison. Egg whites or low-cholesterol egg substitutes. Dried beans, peas, lentils, and tofu.Seeds and most nuts. Dairy Low-fat or nonfat cheeses, including ricotta, string, and mozzarella. Skim or 1% milk that is liquid, powdered, or evaporated. Buttermilk that is made with low-fat milk. Nonfat or low-fat yogurt. Beverages Mineral water. Diet carbonated beverages. Sweets and Desserts Sherbets and fruit ices. Honey, jam, marmalade, jelly, and syrups. Meringues and gelatins. Pure sugar candy, such as hard candy, jelly beans, gumdrops, mints, marshmallows, and small amounts of dark chocolate. [employer] Eat all sweets and desserts in moderation. Fats and Oils Nonhydrogenated (trans-free) margarines. Vegetable oils, including soybean, sesame, sunflower, olive, peanut, safflower, corn, canola, and cottonseed. Salad dressings or mayonnaise that are made with a vegetable oil. Limit added fats and oils that you use for cooking, baking, salads, and as spreads. Other Cocoa powder. Coffee and tea. All seasonings and condiments. The items listed above may not be a complete list of recommended foods or beverages. Contact your dietitian for more options. WHAT FOODS ARE NOT RECOMMENDED? Grains Breads that are made with saturated or trans fats, oils, or whole milk. Croissants. Butter rolls. Cheese breads. Sweet  rolls. Donuts. Buttered popcorn. Chow mein noodles. High-fat crackers, such as cheese or butter crackers. Meats and Other Protein Sources Fatty meats, such as hotdogs, short ribs, sausage, spareribs, bacon, ribeye roast or steak, and mutton. High-fat deli meats, such as salami and bologna. Caviar. Domestic duck and goose. Organ meats, such as kidney, liver, sweetbreads, brains, gizzard, chitterlings, and heart. Dairy Cream, sour cream, cream cheese, and creamed cottage cheese. Whole milk cheeses, including blue (bleu), Monterey Jack, [place], [place], [place], [place], [place], [place], [place], and [place]. Whole or 2% milk that is liquid, evaporated, or condensed. Whole buttermilk. Cream sauce or high-fat cheese sauce. Yogurt  that is made from whole milk. Beverages Regular sodas and drinks with added sugar. Sweets and Desserts Frosting. Pudding. Cookies. Cakes other than angel food cake. Candy that has milk chocolate or white chocolate, hydrogenated fat, butter, coconut, or unknown ingredients. Buttered syrups. Full-fat ice cream or ice cream drinks. Fats and Oils Gravy that has suet, meat fat, or shortening. Cocoa butter, hydrogenated oils, palm oil, coconut oil, palm kernel oil. These can often be found in baked products, candy, fried foods, nondairy creamers, and whipped toppings. Solid fats and shortenings, including bacon fat, salt pork, lard, and butter. Nondairy cream substitutes, such as coffee creamers and sour cream substitutes. Salad dressings that are made of unknown oils, cheese, or sour cream. The items listed above may not be a complete list of foods and beverages to avoid. Contact your dietitian for more information.   This information is not intended to replace advice given to you by your health care provider. Make sure you discuss any questions you have with your health care provider.   Document Released: 04/25/2008 Document Revised: 08/07/2014 Document Reviewed: 01/08/2014 Elsevier  Interactive Patient Education [employer].

## 2016-01-04 ENCOUNTER — Telehealth: Payer: Self-pay | Admitting: Family Medicine

## 2016-01-04 ENCOUNTER — Encounter: Payer: Self-pay | Admitting: Family Medicine

## 2016-01-04 NOTE — Assessment & Plan Note (Signed)
Patient's father was not diagnosed before age 46.  We discussed that he would not be at increased risk however I recommend yearly rectal exams with fecal Hemoccult blood tests. Colonoscopy at age 25.

## 2016-01-04 NOTE — Assessment & Plan Note (Signed)
Treatment per urology.

## 2016-01-04 NOTE — Assessment & Plan Note (Signed)
>>  ASSESSMENT AND PLAN FOR LOW TESTOSTERONE WRITTEN ON 01/04/2016 12:21 AM BY OPALSKI, DEBORAH, DO  Treatment per urology.

## 2016-01-06 ENCOUNTER — Other Ambulatory Visit (INDEPENDENT_AMBULATORY_CARE_PROVIDER_SITE_OTHER): Payer: BLUE CROSS/BLUE SHIELD

## 2016-01-06 DIAGNOSIS — I10 Essential (primary) hypertension: Secondary | ICD-10-CM

## 2016-01-06 DIAGNOSIS — R7989 Other specified abnormal findings of blood chemistry: Secondary | ICD-10-CM

## 2016-01-06 DIAGNOSIS — E669 Obesity, unspecified: Secondary | ICD-10-CM | POA: Diagnosis not present

## 2016-01-06 DIAGNOSIS — Z1321 Encounter for screening for nutritional disorder: Secondary | ICD-10-CM

## 2016-01-06 LAB — CBC WITH DIFFERENTIAL/PLATELET
Basophils Absolute: 82 cells/uL (ref 0–200)
Basophils Relative: 1 %
Eosinophils Absolute: 164 cells/uL (ref 15–500)
Eosinophils Relative: 2 %
HCT: 38.3 % — ABNORMAL LOW (ref 38.5–50.0)
Hemoglobin: 12.5 g/dL — ABNORMAL LOW (ref 13.2–17.1)
Lymphocytes Relative: 36 %
Lymphs Abs: 2952 cells/uL (ref 850–3900)
MCH: 27.5 pg (ref 27.0–33.0)
MCHC: 32.6 g/dL (ref 32.0–36.0)
MCV: 84.2 fL (ref 80.0–100.0)
MPV: 10.3 fL (ref 7.5–12.5)
Monocytes Absolute: 738 cells/uL (ref 200–950)
Monocytes Relative: 9 %
Neutro Abs: 4264 cells/uL (ref 1500–7800)
Neutrophils Relative %: 52 %
Platelets: 283 10*3/uL (ref 140–400)
RBC: 4.55 MIL/uL (ref 4.20–5.80)
RDW: 14.4 % (ref 11.0–15.0)
WBC: 8.2 10*3/uL (ref 3.8–10.8)

## 2016-01-06 NOTE — Progress Notes (Signed)
Encounter opened in error

## 2016-01-06 NOTE — Telephone Encounter (Signed)
Opened in error

## 2016-01-07 LAB — LIPID PANEL
Cholesterol: 176 mg/dL (ref 125–200)
HDL: 41 mg/dL (ref >=40)
LDL Cholesterol: 107 mg/dL (ref <130)
Total CHOL/HDL Ratio: 4.3 Ratio (ref <=5.0)
Triglycerides: 141 mg/dL (ref <150)
VLDL: 28 mg/dL (ref <30)

## 2016-01-07 LAB — HEMOGLOBIN A1C
Hgb A1c MFr Bld: 5.7 % — ABNORMAL HIGH (ref <5.7)
Mean Plasma Glucose: 117 mg/dL

## 2016-01-07 LAB — COMPREHENSIVE METABOLIC PANEL
ALT: 23 U/L (ref 9–46)
AST: 26 U/L (ref 10–40)
Albumin: 3.7 g/dL (ref 3.6–5.1)
Alkaline Phosphatase: 55 U/L (ref 40–115)
BUN: 11 mg/dL (ref 7–25)
CO2: 28 mmol/L (ref 20–31)
Calcium: 8.8 mg/dL (ref 8.6–10.3)
Chloride: 102 mmol/L (ref 98–110)
Creat: 0.76 mg/dL (ref 0.60–1.35)
Glucose, Bld: 74 mg/dL (ref 65–99)
Potassium: 3.9 mmol/L (ref 3.5–5.3)
Sodium: 139 mmol/L (ref 135–146)
Total Bilirubin: 0.4 mg/dL (ref 0.2–1.2)
Total Protein: 6.9 g/dL (ref 6.1–8.1)

## 2016-01-07 LAB — VITAMIN D 25 HYDROXY (VIT D DEFICIENCY, FRACTURES): Vit D, 25-Hydroxy: 33 ng/mL (ref 30–100)

## 2016-01-07 LAB — TSH: TSH: 1.44 mIU/L (ref 0.40–4.50)

## 2016-01-24 ENCOUNTER — Encounter: Payer: Self-pay | Admitting: Family Medicine

## 2016-01-24 DIAGNOSIS — D443 Neoplasm of uncertain behavior of pituitary gland: Secondary | ICD-10-CM | POA: Diagnosis not present

## 2016-01-24 DIAGNOSIS — D444 Neoplasm of uncertain behavior of craniopharyngeal duct: Secondary | ICD-10-CM | POA: Diagnosis not present

## 2016-01-24 DIAGNOSIS — Z0389 Encounter for observation for other suspected diseases and conditions ruled out: Secondary | ICD-10-CM | POA: Diagnosis not present

## 2016-02-09 DIAGNOSIS — F4323 Adjustment disorder with mixed anxiety and depressed mood: Secondary | ICD-10-CM | POA: Diagnosis not present

## 2016-02-10 DIAGNOSIS — D291 Benign neoplasm of prostate: Secondary | ICD-10-CM | POA: Diagnosis not present

## 2016-02-10 DIAGNOSIS — E291 Testicular hypofunction: Secondary | ICD-10-CM | POA: Diagnosis not present

## 2016-02-10 DIAGNOSIS — E221 Hyperprolactinemia: Secondary | ICD-10-CM | POA: Diagnosis not present

## 2016-02-10 DIAGNOSIS — D443 Neoplasm of uncertain behavior of pituitary gland: Secondary | ICD-10-CM | POA: Diagnosis not present

## 2016-02-10 DIAGNOSIS — I10 Essential (primary) hypertension: Secondary | ICD-10-CM | POA: Diagnosis not present

## 2016-02-17 ENCOUNTER — Encounter: Payer: Self-pay | Admitting: Family Medicine

## 2016-03-22 ENCOUNTER — Ambulatory Visit (INDEPENDENT_AMBULATORY_CARE_PROVIDER_SITE_OTHER): Payer: BLUE CROSS/BLUE SHIELD | Admitting: Family Medicine

## 2016-03-22 ENCOUNTER — Encounter: Payer: Self-pay | Admitting: Family Medicine

## 2016-03-22 ENCOUNTER — Other Ambulatory Visit: Payer: Self-pay | Admitting: Physician Assistant

## 2016-03-22 VITALS — BP 128/82 | HR 73 | Wt 298.7 lb

## 2016-03-22 DIAGNOSIS — H6122 Impacted cerumen, left ear: Secondary | ICD-10-CM | POA: Diagnosis not present

## 2016-03-22 DIAGNOSIS — J019 Acute sinusitis, unspecified: Secondary | ICD-10-CM | POA: Diagnosis not present

## 2016-03-22 DIAGNOSIS — I10 Essential (primary) hypertension: Secondary | ICD-10-CM

## 2016-03-22 DIAGNOSIS — J4530 Mild persistent asthma, uncomplicated: Secondary | ICD-10-CM

## 2016-03-22 DIAGNOSIS — E669 Obesity, unspecified: Secondary | ICD-10-CM

## 2016-03-22 DIAGNOSIS — R0989 Other specified symptoms and signs involving the circulatory and respiratory systems: Secondary | ICD-10-CM | POA: Insufficient documentation

## 2016-03-22 DIAGNOSIS — J302 Other seasonal allergic rhinitis: Secondary | ICD-10-CM

## 2016-03-22 DIAGNOSIS — J40 Bronchitis, not specified as acute or chronic: Secondary | ICD-10-CM | POA: Diagnosis not present

## 2016-03-22 DIAGNOSIS — J45909 Unspecified asthma, uncomplicated: Secondary | ICD-10-CM | POA: Insufficient documentation

## 2016-03-22 MED ORDER — AZITHROMYCIN 250 MG PO TABS: ORAL_TABLET | ORAL | 0 refills | Status: DC

## 2016-03-22 MED ORDER — HYDROCOD POLST-CPM POLST ER 10-8 MG/5ML PO SUER: 5.0000 mL | Freq: Two times a day (BID) | ORAL | 0 refills | Status: DC | PRN

## 2016-03-22 MED ORDER — METHYLPREDNISOLONE ACETATE 40 MG/ML IJ SUSP
80.0000 mg | Freq: Once | INTRAMUSCULAR | Status: AC
  Administered 2016-03-22: 80 mg via INTRAMUSCULAR

## 2016-03-22 MED ORDER — PREDNISONE 50 MG PO TABS: ORAL_TABLET | ORAL | 0 refills | Status: DC

## 2016-03-22 NOTE — Assessment & Plan Note (Signed)
Patient will continue on his metformin that he takes from Dr. Redmond Pulling do to suppressing his "hunger moments ". He was told to take it so that he would suppress his desire for chips and such. He does not take it for diabetes or prediabetes  A1c was 5.7 when we last checked it in June

## 2016-03-22 NOTE — Progress Notes (Signed)
Impression and Recommendations:    1. Acute sinusitis, unspecified   2. Bronchitis   3. Other seasonal allergic rhinitis   4. Cerumen impaction, left   5. Asthma, mild persistent, uncomplicated   6. Obesity, unspecified     Depo-Medrol 80 mg in the office today.  Medications per orders - Health and current disease state counseling performed- re: condition, expected length of illness, and when to follow-up  Hold off on the Z-Pak antibiotics 4-3 days and if you're not improved after about 3 days then start them  I Called and spoke with Dr. Suzette Battiest today, patient's endocrinologist and she said it was fine to give the steroids.  I told patient to please note, this may increase his blood pressure a little as well as sugar levels a little.  Please call the office if you are not improving over the next 3-5 days.  Drink lots of fluids. You can consider sinus rinses twice daily with Milta Deiters med sinus rinse and you may use Tylenol cold and sinus pills over-the-counter for headache or as a decongestant.     Indication: Cerumen impaction of the ear(s)  Medical necessity statement: On physical examination, cerumen impairs clinically significant portions of the external auditory canal, and tympanic membrane.  Noted obstructive, copious cerumen that cannot be removed without magnification and instrumentations requiring physician skills  Consent: Discussed benefits and risks of procedure and verbal consent obtained  Procedure: Patient was prepped for the procedure.  Utilized an otoscope to assess and take note of the ear canal, the tympanic membrane, and the presence, amount, and placement of the cerumen. Gentle water irrigation and soft plastic curette was utilized to remove cerumen.   Post procedure examination: shows cerumen was removed. TM intact.    Post-Care Instructions:   Patient tolerated procedure well.  Proper ear care d/c pt.   The patient is made aware that they may experience  temporary vertigo, temporary hearing loss, and temporary discomfort.  If these symptom last for more than 24 hours to call the clinic or proceed to the ED/Urgent Care.     Patient's Medications  New Prescriptions   AZITHROMYCIN (ZITHROMAX Z-PAK) 250 MG TABLET    Take 2 tablets (500 mg) on  Day 1,  followed by 1 tablet (250 mg) once daily on Days 2 through 5.   CHLORPHENIRAMINE-HYDROCODONE (TUSSIONEX) 10-8 MG/5ML SUER    Take 5 mLs by mouth every 12 (twelve) hours as needed for cough (cough, will cause drowsiness.).   PREDNISONE (DELTASONE) 50 MG TABLET    Take 50 mg daily 5 days do not start the medicine until 8\24\17  Previous Medications   ALBUTEROL (PROVENTIL HFA;VENTOLIN HFA) 108 (90 BASE) MCG/ACT INHALER    Inhale 2 puffs into the lungs every 4 (four) hours as needed for wheezing (cough, shortness of breath or wheezing.).   ATENOLOL-CHLORTHALIDONE (TENORETIC) 50-25 MG PER TABLET    Take 0.5 tablets by mouth daily.   CABERGOLINE (DOSTINEX) 0.5 MG TABLET    Take 1 tablet by mouth 2 (two) times a week.   CLOBETASOL CREAM (TEMOVATE) 0.05 %    Apply topically 2 (two) times daily.   CLOMIPHENE (CLOMID) 50 MG TABLET    Take 1 tablet by mouth daily.   FLUTICASONE (FLONASE) 50 MCG/ACT NASAL SPRAY    Place 2 sprays into both nostrils daily.   METFORMIN (GLUCOPHAGE-XR) 500 MG 24 HR TABLET    Take 1 tablet by mouth daily with supper.   MOMETASONE-FORMOTEROL (DULERA)  200-5 MCG/ACT AERO    Inhale 2 puffs into the lungs 2 (two) times daily.   MONTELUKAST (SINGULAIR) 10 MG TABLET    Take 1 tablet (10 mg total) by mouth at bedtime.  Modified Medications   No medications on file  Discontinued Medications   No medications on file    Return in about 1 month (around 04/22/2016) for Follow-up of current medical issues.  The patient was counseled, risk factors were discussed, anticipatory guidance given.  Gross side effects, risk and benefits, and alternatives of medications discussed with patient.   Patient is aware that all medications have potential side effects and we are unable to predict every side effect or drug-drug interaction that may occur.  Expresses verbal understanding and consents to current therapy plan and treatment regimen.  Please see AVS handed out to patient at the end of our visit for further patient instructions/ counseling done pertaining to today's office visit.    Note: This document was prepared using Dragon voice recognition software and may include unintentional dictation errors.   --------------------------------------------------------------------------------------------------------------------------------------------------------------------------------------------------------------------------------------------    Subjective:    CC:  Chief Complaint  Patient presents with  . Cough    HPI: David Aguilar is a 45 y.o. male who presents to [place] at [facility] today for issues as discussed below.  Coughing since sat am--  Scratchy throat 2 days, sinus drainage.  Happens ever yr around this time. NO F/C, feels fine.   Patient thought he was wheezing last night.   -  Taking luiquid cough supp and nothing else.   Tussionex helps a lot and zpak/ steroids helped a lot too in the past.       Wt Readings from Last 3 Encounters:  03/22/16 298 lb 11.2 oz (135.5 kg)  01/03/16 (!) 315 lb 11.2 oz (143.2 kg)  05/25/15 (!) 309 lb (140.2 kg)   BP Readings from Last 3 Encounters:  03/22/16 128/82  01/03/16 122/85  05/25/15 122/80   Pulse Readings from Last 3 Encounters:  03/22/16 73  01/03/16 76  05/25/15 80     Patient Active Problem List   Diagnosis Date Noted  . Bronchitis 03/22/2016  . Asthma 01/03/2016  . 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 Low testosterone 05/18/2015  . Eczema 03/24/2015  . Cough 03/24/2015  . Essential hypertension 12/13/2012  .  Family history of colon cancer 12/13/2012  . Obesity, unspecified 12/12/2012  . Achilles tendonitis 12/12/2012  . Allergic rhinitis 08/16/2012    Past Medical history, Surgical history, Family history, Social history, Allergies and Medications have been entered into the medical record, reviewed and changed as needed.    Allergies:  No Known Allergies   Review of Systems: No fever/ chills, night sweats, no unintended weight loss, No chest pain, or increased shortness of breath. No N/V/D.  Pertinent positives and negatives noted in HPI above    Objective:   Blood pressure 128/82, pulse 73, weight 298 lb 11.2 oz (135.5 kg). Body mass index is 39.95 kg/m.  General: Well Developed, well nourished, appropriate for stated age.  Neuro: Alert and oriented x3, extra-ocular muscles intact, sensation grossly intact.  HEENT: Normocephalic, atraumatic, neck supple; TMs within normal limits bilaterally after I extracted large amount of dried cerumen from left external auditory canal. Patient tolerated this well.  Oropharynx clear with mild erythema otherwise normal. Nares edematous and erythematous. No tenderness to palpation in any of his sinuses.  No lymphadenopathy anterior cervical or posterior appreciated. Skin: Warm and dry, no gross rash. Cardiac: RRR, S1 S2,  no murmurs rubs or gallops.  Respiratory: ECTA B/L, No wheezes appreciated but has mild left lower quad course BS. Not using accessory muscles, speaking in full sentences-unlabored. Vascular:  No gross lower ext edema, cap RF less 2 sec. Psych: No SI/HI, Insight and judgement good

## 2016-03-22 NOTE — Patient Instructions (Addendum)
Hold off on the Z-Pak antibiotics 4-3 days and if you're not improved after about 3 days then start them  I spoke with Dr. Redmond Pulling today, your endocrinologist and she said it was fine to give you the steroids.  Please note, this may increase your blood pressure a little as well as sugar levels a little.  Please call the office if you are not improving over the next 3-5 days.  Drink lots of fluids. You can consider sinus rinses twice daily with Milta Deiters med sinus rinse and you may use Tylenol cold and sinus pills over-the-counter for headache or as a decongestant.   Acute Bronchitis Bronchitis is inflammation of the airways that extend from the windpipe into the lungs (bronchi). The inflammation often causes mucus to develop. This leads to a cough, which is the most common symptom of bronchitis.  In acute bronchitis, the condition usually develops suddenly and goes away over time, usually in a couple weeks. Smoking, allergies, and asthma can make bronchitis worse. Repeated episodes of bronchitis may cause further lung problems.  CAUSES Acute bronchitis is most often caused by the same virus that causes a cold. The virus can spread from person to person (contagious) through coughing, sneezing, and touching contaminated objects. SIGNS AND SYMPTOMS   Cough.   Fever.   Coughing up mucus.   Body aches.   Chest congestion.   Chills.   Shortness of breath.   Sore throat.  DIAGNOSIS  Acute bronchitis is usually diagnosed through a physical exam. Your health care provider will also ask you questions about your medical history. Tests, such as chest X-rays, are sometimes done to rule out other conditions.  TREATMENT  Acute bronchitis usually goes away in a couple weeks. Oftentimes, no medical treatment is necessary. Medicines are sometimes given for relief of fever or cough. Antibiotic medicines are usually not needed but may be prescribed in certain situations. In some cases, an inhaler  may be recommended to help reduce shortness of breath and control the cough. A cool mist vaporizer may also be used to help thin bronchial secretions and make it easier to clear the chest.  HOME CARE INSTRUCTIONS  Get plenty of rest.   Drink enough fluids to keep your urine clear or pale yellow (unless you have a medical condition that requires fluid restriction). Increasing fluids may help thin your respiratory secretions (sputum) and reduce chest congestion, and it will prevent dehydration.   Take medicines only as directed by your health care provider.  If you were prescribed an antibiotic medicine, finish it all even if you start to feel better.  Avoid smoking and secondhand smoke. Exposure to cigarette smoke or irritating chemicals will make bronchitis worse. If you are a smoker, consider using nicotine gum or skin patches to help control withdrawal symptoms. Quitting smoking will help your lungs heal faster.   Reduce the chances of another bout of acute bronchitis by washing your hands frequently, avoiding people with cold symptoms, and trying not to touch your hands to your mouth, nose, or eyes.   Keep all follow-up visits as directed by your health care provider.  SEEK MEDICAL CARE IF: Your symptoms do not improve after 1 week of treatment.  SEEK IMMEDIATE MEDICAL CARE IF:  You develop an increased fever or chills.   You have chest pain.   You have severe shortness of breath.  You have bloody sputum.   You develop dehydration.  You faint or repeatedly feel like you are going to pass  out.  You develop repeated vomiting.  You develop a severe headache. MAKE SURE YOU:   Understand these instructions.  Will watch your condition.  Will get help right away if you are not doing well or get worse.   This information is not intended to replace advice given to you by your health care provider. Make sure you discuss any questions you have with your health care  provider.   Document Released: 08/24/2004 Document Revised: 08/07/2014 Document Reviewed: 01/07/2013 Elsevier Interactive Patient Education [employer].

## 2016-03-27 ENCOUNTER — Other Ambulatory Visit: Payer: Self-pay

## 2016-03-27 DIAGNOSIS — I10 Essential (primary) hypertension: Secondary | ICD-10-CM

## 2016-03-27 MED ORDER — ATENOLOL-CHLORTHALIDONE 50-25 MG PO TABS: 0.5000 | ORAL_TABLET | Freq: Every day | ORAL | 3 refills | Status: DC

## 2016-04-06 DIAGNOSIS — E291 Testicular hypofunction: Secondary | ICD-10-CM | POA: Diagnosis not present

## 2016-04-12 DIAGNOSIS — E291 Testicular hypofunction: Secondary | ICD-10-CM | POA: Diagnosis not present

## 2016-04-25 DIAGNOSIS — F4323 Adjustment disorder with mixed anxiety and depressed mood: Secondary | ICD-10-CM | POA: Diagnosis not present

## 2016-05-04 DIAGNOSIS — F4323 Adjustment disorder with mixed anxiety and depressed mood: Secondary | ICD-10-CM | POA: Diagnosis not present

## 2016-05-10 ENCOUNTER — Other Ambulatory Visit: Payer: Self-pay

## 2016-05-10 DIAGNOSIS — Z9109 Other allergy status, other than to drugs and biological substances: Secondary | ICD-10-CM

## 2016-05-10 DIAGNOSIS — R062 Wheezing: Secondary | ICD-10-CM

## 2016-05-10 MED ORDER — MONTELUKAST SODIUM 10 MG PO TABS: 10.0000 mg | ORAL_TABLET | Freq: Every day | ORAL | 1 refills | Status: DC

## 2016-05-10 MED ORDER — MOMETASONE FURO-FORMOTEROL FUM 200-5 MCG/ACT IN AERO: 2.0000 | INHALATION_SPRAY | Freq: Two times a day (BID) | RESPIRATORY_TRACT | 1 refills | Status: DC

## 2016-05-10 NOTE — Telephone Encounter (Signed)
Received a fax from [facility] for a refill on montelukast 10 mg tablet to take once daily and Sulera 200 mcg/5 mcg inhale 2 puffs into the lungs bid. These medications were last prescribed by a different provider. Please advise.

## 2016-05-16 ENCOUNTER — Ambulatory Visit (INDEPENDENT_AMBULATORY_CARE_PROVIDER_SITE_OTHER): Payer: BLUE CROSS/BLUE SHIELD | Admitting: Family Medicine

## 2016-05-16 ENCOUNTER — Encounter: Payer: Self-pay | Admitting: Family Medicine

## 2016-05-16 VITALS — BP 125/83 | HR 69 | Ht 72.5 in | Wt 307.2 lb

## 2016-05-16 DIAGNOSIS — R7303 Prediabetes: Secondary | ICD-10-CM

## 2016-05-16 DIAGNOSIS — I10 Essential (primary) hypertension: Secondary | ICD-10-CM

## 2016-05-16 DIAGNOSIS — Z0000 Encounter for general adult medical examination without abnormal findings: Secondary | ICD-10-CM | POA: Insufficient documentation

## 2016-05-16 DIAGNOSIS — Z7189 Other specified counseling: Secondary | ICD-10-CM

## 2016-05-16 DIAGNOSIS — J3089 Other allergic rhinitis: Secondary | ICD-10-CM | POA: Diagnosis not present

## 2016-05-16 DIAGNOSIS — E66813 Obesity, class 3: Secondary | ICD-10-CM

## 2016-05-16 NOTE — Patient Instructions (Addendum)
Twice daily Milta Deiters med sinus or AYR; also advised allegra D daily   reck a1c 4 mo.      Prediabetes Eating Plan Prediabetes--also called impaired glucose tolerance or impaired fasting glucose--is a condition that causes blood sugar (blood glucose) levels to be higher than normal. Following a healthy diet can help to keep prediabetes under control. It can also help to lower the risk of type 2 diabetes and heart disease, which are increased in people who have prediabetes. Along with regular exercise, a healthy diet:  Promotes weight loss.  Helps to control blood sugar levels.  Helps to improve the way that the body uses insulin. WHAT DO I NEED TO KNOW ABOUT THIS EATING PLAN?  Use the glycemic index (GI) to plan your meals. The index tells you how quickly a food will raise your blood sugar. Choose low-GI foods. These foods take a longer time to raise blood sugar.  Pay close attention to the amount of carbohydrates in the food that you eat. Carbohydrates increase blood sugar levels.  Keep track of how many calories you take in. Eating the right amount of calories will help you to achieve a healthy weight. Losing about 7 percent of your starting weight can help to prevent type 2 diabetes.  You may want to follow a Mediterranean diet. This diet includes a lot of vegetables, lean meats or fish, whole grains, fruits, and healthy oils and fats. WHAT FOODS CAN I EAT? Grains Whole grains, such as whole-wheat or whole-grain breads, crackers, cereals, and pasta. Unsweetened oatmeal. Bulgur. Barley. Quinoa. Brown rice. Corn or whole-wheat flour tortillas or taco shells. Vegetables Lettuce. Spinach. Peas. Beets. Cauliflower. Cabbage. Broccoli. Carrots. Tomatoes. Squash. Eggplant. Herbs. Peppers. Onions. Cucumbers. Brussels sprouts. Fruits Berries. Bananas. Apples. Oranges. Grapes. Papaya. Mango. Pomegranate. Kiwi. Grapefruit. Cherries. Meats and Other Protein Sources Seafood. Lean meats, such as  chicken and [place] or lean cuts of pork and beef. Tofu. Eggs. Nuts. Beans. Dairy Low-fat or fat-free dairy products, such as yogurt, cottage cheese, and cheese. Beverages Water. Tea. Coffee. Sugar-free or diet soda. Seltzer water. Milk. Milk alternatives, such as soy or almond milk. Condiments Mustard. Relish. Low-fat, low-sugar ketchup. Low-fat, low-sugar barbecue sauce. Low-fat or fat-free mayonnaise. Sweets and Desserts Sugar-free or low-fat pudding. Sugar-free or low-fat ice cream and other frozen treats. Fats and Oils Avocado. Walnuts. Olive oil. The items listed above may not be a complete list of recommended foods or beverages. Contact your dietitian for more options.  WHAT FOODS ARE NOT RECOMMENDED? Grains Refined white flour and flour products, such as bread, pasta, snack foods, and cereals. Beverages Sweetened drinks, such as sweet iced tea and soda. Sweets and Desserts Baked goods, such as cake, cupcakes, pastries, cookies, and cheesecake. The items listed above may not be a complete list of foods and beverages to avoid. Contact your dietitian for more information.   This information is not intended to replace advice given to you by your health care provider. Make sure you discuss any questions you have with your health care provider.   Document Released: 12/01/2014 Document Reviewed: 12/01/2014 Elsevier Interactive Patient Education 2016 [place] factors for prediabetes and type 2 diabetes  Researchers don't fully understand why some people develop prediabetes and type 2 diabetes and others don't.  It's clear that certain factors increase the risk, however, including:  Weight. The more fatty tissue you have, the more resistant your cells become to insulin.  Inactivity. The less active you are, the greater  your risk. Physical activity helps you control your weight, uses up glucose as energy and makes your cells more sensitive to insulin.  Family history.  Your risk increases if a parent or sibling has type 2 diabetes.  Race. Although it's unclear why, people of certain races - including blacks, Hispanics, American Indians and Asian-Americans - are at higher risk.  Age. Your risk increases as you get older. This may be because you tend to exercise less, lose muscle mass and gain weight as you age. But type 2 diabetes is also increasing dramatically among children, adolescents and younger adults.  Gestational diabetes. If you developed gestational diabetes when you were pregnant, your risk of developing prediabetes and type 2 diabetes later increases. If you gave birth to a baby weighing more than 9 pounds (4 kilograms), you're also at risk of type 2 diabetes.  Polycystic ovary syndrome. For women, having polycystic ovary syndrome - a common condition characterized by irregular menstrual periods, excess hair growth and obesity - increases the risk of diabetes.  High blood pressure. Having blood pressure over 140/90 millimeters of mercury (mm Hg) is linked to an increased risk of type 2 diabetes.  Abnormal cholesterol and triglyceride levels. If you have low levels of high-density lipoprotein (HDL), or "good," cholesterol, your risk of type 2 diabetes is higher. Triglycerides are another type of fat carried in the blood. People with high levels of triglycerides have an increased risk of type 2 diabetes. Your doctor can let you know what your cholesterol and triglyceride levels are.    A good guide to good carbs: The glycemic index ---If you have diabetes, or at risk for diabetes, you know all too well that when you eat carbohydrates, your blood sugar goes up. The total amount of carbs you consume at a meal or in a snack mostly determines what your blood sugar will do. But the food itself also plays a role. A serving of white rice has almost the same effect as eating pure table sugar - a quick, high spike in blood sugar. A serving of lentils has a slower,  smaller effect.  ---Picking good sources of carbs can help you control your blood sugar and your weight. Even if you don't have diabetes, eating healthier carbohydrate-rich foods can help ward off a host of chronic conditions, from heart disease to various cancers to, well, diabetes.  ---One way to choose foods is with the glycemic index (GI). This tool measures how much a food boosts blood sugar.  The glycemic index rates the effect of a specific amount of a food on blood sugar compared with the same amount of pure glucose. A food with a glycemic index of 28 boosts blood sugar only 28% as much as pure glucose. One with a GI of 95 acts like pure glucose.  High glycemic foods result in a quick spike in insulin and blood sugar (also known as blood glucose).  Low glycemic foods have a slower, smaller effect- these are healthier for you.   Using the glycemic index Using the glycemic index is easy: choose foods in the low GI category instead of those in the high GI category (see below), and go easy on those in between. Low glycemic index (GI of 55 or less): Most fruits and vegetables, beans, minimally processed grains, pasta, low-fat dairy foods, and nuts.  Moderate glycemic index (GI 56 to 69): White and sweet potatoes, corn, white rice, couscous, breakfast cereals such as Cream of Wheat and Mini Wheats.  High glycemic index (GI of 70 or higher): White bread, rice cakes, most crackers, bagels, cakes, doughnuts, croissants, most packaged breakfast cereals. You can see the values for 100 commons foods and get links to more at www.health.CheapToothpicks.si.  Swaps for lowering glycemic index  Instead of this high-glycemic index food Eat this lower-glycemic index food  White rice Brown rice or converted rice  Instant oatmeal Steel-cut oats  Cornflakes Bran flakes  Baked potato Pasta, bulgur  White bread Whole-grain bread  Corn Peas or leafy greens
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
## 2016-06-12 DIAGNOSIS — E291 Testicular hypofunction: Secondary | ICD-10-CM | POA: Diagnosis not present

## 2016-06-12 DIAGNOSIS — I10 Essential (primary) hypertension: Secondary | ICD-10-CM | POA: Diagnosis not present

## 2016-06-12 DIAGNOSIS — E221 Hyperprolactinemia: Secondary | ICD-10-CM | POA: Diagnosis not present

## 2016-06-12 DIAGNOSIS — R7301 Impaired fasting glucose: Secondary | ICD-10-CM | POA: Diagnosis not present

## 2016-06-12 DIAGNOSIS — D443 Neoplasm of uncertain behavior of pituitary gland: Secondary | ICD-10-CM | POA: Diagnosis not present

## 2016-07-18 DIAGNOSIS — F4323 Adjustment disorder with mixed anxiety and depressed mood: Secondary | ICD-10-CM | POA: Diagnosis not present

## 2016-07-19 ENCOUNTER — Encounter: Payer: Self-pay | Admitting: Family Medicine

## 2016-07-19 ENCOUNTER — Ambulatory Visit (INDEPENDENT_AMBULATORY_CARE_PROVIDER_SITE_OTHER): Payer: BLUE CROSS/BLUE SHIELD | Admitting: Family Medicine

## 2016-07-19 VITALS — BP 108/76 | HR 81 | Temp 98.7°F | Ht 72.5 in | Wt 296.1 lb

## 2016-07-19 DIAGNOSIS — J4540 Moderate persistent asthma, uncomplicated: Secondary | ICD-10-CM | POA: Diagnosis not present

## 2016-07-19 DIAGNOSIS — R05 Cough: Secondary | ICD-10-CM

## 2016-07-19 DIAGNOSIS — R058 Other specified cough: Secondary | ICD-10-CM

## 2016-07-19 DIAGNOSIS — J0120 Acute ethmoidal sinusitis, unspecified: Secondary | ICD-10-CM | POA: Diagnosis not present

## 2016-07-19 MED ORDER — HYDROCOD POLST-CPM POLST ER 10-8 MG/5ML PO SUER: 5.0000 mL | Freq: Two times a day (BID) | ORAL | 0 refills | Status: DC | PRN

## 2016-07-19 MED ORDER — AZITHROMYCIN 250 MG PO TABS: ORAL_TABLET | ORAL | 0 refills | Status: DC

## 2016-07-19 MED ORDER — PREDNISONE 50 MG PO TABS: 50.0000 mg | ORAL_TABLET | Freq: Every day | ORAL | 0 refills | Status: DC

## 2016-07-19 NOTE — Patient Instructions (Signed)

## 2016-07-19 NOTE — Progress Notes (Signed)
Assessment and plan:  1. Acute ethmoidal sinusitis, recurrence not specified   2. Productive cough   3. Moderate persistent asthma, unspecified whether complicated     1. This combo works well for him:  z-pak, prednisone, tussionex. - this has worked well for sinusitis  Anticipatory guidance and routine counseling done re: condition, txmnt options and need for follow up. All questions of patient's were answered.  - Viral vs Allergic vs Bacterial causes for pt's symptoms reveiwed.    - Supportive care and various OTC medications discussed in addition to any prescribed. - Call or RTC if new symptoms, or if no improvement or worse over next couple days.   - Will consider ABX at that time if sx continue past 5-7 days and worsening.    New Prescriptions   AZITHROMYCIN (ZITHROMAX Z-PAK) 250 MG TABLET    As directed   CHLORPHENIRAMINE-HYDROCODONE (TUSSIONEX) 10-8 MG/5ML SUER    Take 5 mLs by mouth every 12 (twelve) hours as needed for cough (cough, will cause drowsiness.).   PREDNISONE (DELTASONE) 50 MG TABLET    Take 1 tablet (50 mg total) by mouth daily.    Modified Medications   No medications on file    Discontinued Medications   No medications on file     Gross side effects, risk and benefits, and alternatives of medications discussed with patient.  Patient is aware that all medications have potential side effects and we are unable to predict every sideeffect or drug-drug interaction that may occur.  Expresses verbal understanding and consents to current therapy plan and treatment regiment.  Return if symptoms worsen or fail to improve, for Follow-up of current medical issues as discussed prior.  Please see AVS handed out to patient at the end of our visit for additional patient instructions/ counseling done pertaining to today's office visit.  Note: This document was prepared using Dragon voice recognition software and may include unintentional dictation  errors.    Subjective:    Chief Complaint  Patient presents with  . Cough    HPI:  Pt presents with URI sx for since Friday- 6  days.      Coughing and sinus drainage , now persistent mostly productive, coughing- esp at night.  + wh but not SOB/ DIB; using dulera BID, hasn't tried additional albuterol.   Denies objective F/C, No face pain or ear pain, No N/V/D, No SOB/DIB, No Rash.     Tried:   Alka seltzer severe cold and sinus for sx.    Overall getting worse.     Patient Active Problem List   Diagnosis Date Noted  . Asthma 01/03/2016    Priority: High  . Prediabetes 05/16/2016  . Counseling on health promotion and disease prevention 05/16/2016  . Environmental and seasonal allergies 05/16/2016  . Bronchitis 03/22/2016  . Hyperprolactinemia ([place]) 08/20/2015  . 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 Low testosterone 05/18/2015  . Eczema 03/24/2015  . Cough 03/24/2015  . Essential hypertension 12/13/2012  . Family history of colon cancer 12/13/2012  . Obesity, Class III, BMI 40-49.9 (morbid obesity) ([place]) 12/12/2012  . Achilles tendonitis 12/12/2012  . Allergic rhinitis 08/16/2012    Past medical history, Surgical history, Family history reviewed and noted below, Social history, Allergies, and Medications have been entered into the medical record, reviewed and changed as needed.   No Known Allergies  Review of Systems  Constitutional: Negative for chills and malaise/fatigue.  HENT: Positive for congestion and nosebleeds.  Negative for ear discharge, ear pain, sinus pain and sore throat.   Respiratory: Positive for cough and wheezing. Negative for sputum production and shortness of breath.   Gastrointestinal: Negative for diarrhea, nausea and vomiting.  Neurological: Negative for dizziness and headaches.  Psychiatric/Behavioral: Negative for depression. The patient does not have insomnia.     Objective:   Blood pressure 108/76,  pulse 81, temperature 98.7 F (37.1 C), height 6' 0.5" (1.842 m), weight 296 lb 1.6 oz (134.3 kg), SpO2 98 %. Body mass index is 39.61 kg/m. General: Well Developed, well nourished, appropriate for stated age.  Neuro: Alert and oriented x3, extra-ocular muscles intact, sensation grossly intact.  HEENT: Normocephalic, atraumatic, pupils equal round reactive to light, neck supple, no masses, no painful lymphadenopathy, TM's intact B/L, no acute findings. Nares- patent, clear d/c, OP- clear, mild erythema, No TTP sinuses Skin: Warm and dry, no gross rash. Cardiac: RRR, S1 S2,  no murmurs rubs or gallops.  Respiratory: ECTA B/L and A/P, Not using accessory muscles, speaking in full sentences- unlabored. Vascular:  No gross lower ext edema, cap RF less 2 sec. Psych: No HI/SI, judgement and insight good, Euthymic mood. Full Affect.   Patient Care Team    Relationship Specialty Notifications Start End  Mellody Dance, DO PCP - General Family Medicine  01/03/16   Jacelyn Pi, MD Consulting Physician Endocrinology  08/20/15   Carolan Clines, MD Consulting Physician Urology  05/16/16

## 2016-09-04 ENCOUNTER — Other Ambulatory Visit: Payer: Self-pay

## 2016-09-04 DIAGNOSIS — Z9109 Other allergy status, other than to drugs and biological substances: Secondary | ICD-10-CM

## 2016-09-04 DIAGNOSIS — R062 Wheezing: Secondary | ICD-10-CM

## 2016-09-04 MED ORDER — MONTELUKAST SODIUM 10 MG PO TABS: 10.0000 mg | ORAL_TABLET | Freq: Every day | ORAL | 0 refills | Status: DC

## 2016-09-06 ENCOUNTER — Other Ambulatory Visit (INDEPENDENT_AMBULATORY_CARE_PROVIDER_SITE_OTHER): Payer: BLUE CROSS/BLUE SHIELD

## 2016-09-06 DIAGNOSIS — R7303 Prediabetes: Secondary | ICD-10-CM

## 2016-09-07 LAB — HEMOGLOBIN A1C
Est. average glucose Bld gHb Est-mCnc: 108 mg/dL
Hgb A1c MFr Bld: 5.4 % (ref 4.8–5.6)

## 2016-09-07 LAB — COMPREHENSIVE METABOLIC PANEL
ALT: 31 IU/L (ref 0–44)
AST: 27 IU/L (ref 0–40)
Albumin/Globulin Ratio: 1.4 (ref 1.2–2.2)
Albumin: 3.9 g/dL (ref 3.5–5.5)
Alkaline Phosphatase: 52 IU/L (ref 39–117)
BUN/Creatinine Ratio: 19 (ref 9–20)
BUN: 14 mg/dL (ref 6–24)
Bilirubin Total: 0.4 mg/dL (ref 0.0–1.2)
CO2: 25 mmol/L (ref 18–29)
Calcium: 9.2 mg/dL (ref 8.7–10.2)
Chloride: 99 mmol/L (ref 96–106)
Creatinine, Ser: 0.73 mg/dL — ABNORMAL LOW (ref 0.76–1.27)
GFR calc Af Amer: 129 mL/min/{1.73_m2} (ref >59)
GFR calc non Af Amer: 111 mL/min/{1.73_m2} (ref >59)
Globulin, Total: 2.8 g/dL (ref 1.5–4.5)
Glucose: 73 mg/dL (ref 65–99)
Potassium: 4.6 mmol/L (ref 3.5–5.2)
Sodium: 140 mmol/L (ref 134–144)
Total Protein: 6.7 g/dL (ref 6.0–8.5)

## 2016-10-03 ENCOUNTER — Other Ambulatory Visit: Payer: Self-pay | Admitting: Family Medicine

## 2016-10-03 DIAGNOSIS — F4323 Adjustment disorder with mixed anxiety and depressed mood: Secondary | ICD-10-CM | POA: Diagnosis not present

## 2016-10-03 DIAGNOSIS — R062 Wheezing: Secondary | ICD-10-CM

## 2016-10-06 ENCOUNTER — Ambulatory Visit: Payer: BLUE CROSS/BLUE SHIELD | Admitting: Family Medicine

## 2016-11-17 ENCOUNTER — Ambulatory Visit: Payer: BLUE CROSS/BLUE SHIELD | Admitting: Family Medicine

## 2016-12-05 DIAGNOSIS — F4323 Adjustment disorder with mixed anxiety and depressed mood: Secondary | ICD-10-CM | POA: Diagnosis not present

## 2016-12-18 ENCOUNTER — Ambulatory Visit (INDEPENDENT_AMBULATORY_CARE_PROVIDER_SITE_OTHER): Payer: BLUE CROSS/BLUE SHIELD | Admitting: Family Medicine

## 2016-12-18 ENCOUNTER — Encounter: Payer: Self-pay | Admitting: Family Medicine

## 2016-12-18 VITALS — BP 109/69 | HR 61 | Ht 72.5 in | Wt 287.4 lb

## 2016-12-18 DIAGNOSIS — I10 Essential (primary) hypertension: Secondary | ICD-10-CM

## 2016-12-18 DIAGNOSIS — R7303 Prediabetes: Secondary | ICD-10-CM

## 2016-12-18 DIAGNOSIS — Z630 Problems in relationship with spouse or partner: Secondary | ICD-10-CM

## 2016-12-18 DIAGNOSIS — J309 Allergic rhinitis, unspecified: Secondary | ICD-10-CM

## 2016-12-18 DIAGNOSIS — J3089 Other allergic rhinitis: Secondary | ICD-10-CM

## 2016-12-18 DIAGNOSIS — Z9109 Other allergy status, other than to drugs and biological substances: Secondary | ICD-10-CM

## 2016-12-18 DIAGNOSIS — R457 State of emotional shock and stress, unspecified: Secondary | ICD-10-CM

## 2016-12-18 DIAGNOSIS — E221 Hyperprolactinemia: Secondary | ICD-10-CM

## 2016-12-18 DIAGNOSIS — J4540 Moderate persistent asthma, uncomplicated: Secondary | ICD-10-CM | POA: Diagnosis not present

## 2016-12-18 DIAGNOSIS — R062 Wheezing: Secondary | ICD-10-CM

## 2016-12-18 LAB — POCT GLYCOSYLATED HEMOGLOBIN (HGB A1C): Hemoglobin A1C: 5.5

## 2016-12-18 MED ORDER — MONTELUKAST SODIUM 10 MG PO TABS: 10.0000 mg | ORAL_TABLET | Freq: Every day | ORAL | 3 refills | Status: DC

## 2016-12-18 MED ORDER — MOMETASONE FURO-FORMOTEROL FUM 200-5 MCG/ACT IN AERO: 2.0000 | INHALATION_SPRAY | Freq: Every day | RESPIRATORY_TRACT | 11 refills | Status: DC

## 2016-12-18 MED ORDER — ALBUTEROL SULFATE HFA 108 (90 BASE) MCG/ACT IN AERS: 2.0000 | INHALATION_SPRAY | RESPIRATORY_TRACT | 2 refills | Status: DC | PRN

## 2016-12-18 MED ORDER — FLUTICASONE PROPIONATE 50 MCG/ACT NA SUSP: 1.0000 | Freq: Two times a day (BID) | NASAL | 11 refills | Status: DC

## 2016-12-18 MED ORDER — ATENOLOL-CHLORTHALIDONE 50-25 MG PO TABS: 0.5000 | ORAL_TABLET | Freq: Every day | ORAL | 3 refills | Status: DC

## 2016-12-18 NOTE — Progress Notes (Signed)
Impression and Recommendations:    1. Prediabetes   2. Obesity, Class III, BMI 40-49.9 (morbid obesity) ([place])   3. Moderate persistent asthma, unspecified whether complicated   4. Essential hypertension   5. Environmental and seasonal allergies   6. Allergic rhinitis, unspecified seasonality, unspecified trigger   7. Marital dysfunction   8. Under emotional stress due to marital discourse      Obesity, Class III, BMI 40-49.9 (morbid obesity) ([facility]) - phenteramine and other wt loss meds- not candidate due to pituitary issues per Dr Chalmers Cater- Endo  - rec [employer]  - start lose it and f/up if he'd like to discuss wt loss goals etc.  Pt thinks he will just cont on his own to slowly lose/ cut portions    Essential hypertension Controlled-  No change tcxmnt plan  RF's given    Prediabetes 76mo ago- was 5.7 despite being on metformin for pituitary adenoma per pt-- NOT on it for pre-DM  Wt loss & Exercise counseling    reck q 37mo recommended      Marital dysfunction Advised counseling/ CBT for him self  - distressed by this but wife unable to admit to faults per pt  - delcines meds; well adjsted and good stress mgt techniques    Under emotional stress due to marital discourse  Exercise  Counseling  Meditation/ prayer    Asthma Well controlled on daily meds- rarely uses albuterol  RF given, cont meds    Hyperprolactinemia ([place]) Per Dr Chalmers Cater    Environmental and seasonal allergies flonase  Rec sinus rinses BID     The patient was counseled, risk factors were discussed, anticipatory guidance given.    Meds ordered this encounter  Medications  . fluticasone (FLONASE) 50 MCG/ACT nasal spray    Sig: Place 1 spray into both nostrils 2 (two) times daily.    Dispense:  48 g    Refill:  11  . atenolol-chlorthalidone (TENORETIC) 50-25 MG tablet    Sig: Take 0.5 tablets by mouth daily.    Dispense:  45 tablet    Refill:  3  . montelukast  (SINGULAIR) 10 MG tablet    Sig: Take 1 tablet (10 mg total) by mouth at bedtime.    Dispense:  90 tablet    Refill:  3  . mometasone-formoterol (DULERA) 200-5 MCG/ACT AERO    Sig: Inhale 2 puffs into the lungs daily.    Dispense:  13 g    Refill:  11  . albuterol (PROVENTIL HFA;VENTOLIN HFA) 108 (90 Base) MCG/ACT inhaler    Sig: Inhale 2 puffs into the lungs every 4 (four) hours as needed for wheezing (cough, shortness of breath or wheezing.).    Dispense:  1 Inhaler    Refill:  2     Discontinued Medications   AZITHROMYCIN (ZITHROMAX Z-PAK) 250 MG TABLET    As directed   CHLORPHENIRAMINE-HYDROCODONE (TUSSIONEX) 10-8 MG/5ML SUER    Take 5 mLs by mouth every 12 (twelve) hours as needed for cough (cough, will cause drowsiness.).   PREDNISONE (DELTASONE) 50 MG TABLET    Take 1 tablet (50 mg total) by mouth daily.      Orders Placed This Encounter  Procedures  . POCT glycosylated hemoglobin (Hb A1C)     Gross side effects, risk and benefits, and alternatives of medications and treatment plan in general discussed with patient.  Patient is aware that all medications have potential side effects and we are unable to predict  every side effect or drug-drug interaction that may occur.   Patient will call with any questions prior to using medication if they have concerns.  Expresses verbal understanding and consents to current therapy and treatment regimen.  No barriers to understanding were identified.  Red flag symptoms and signs discussed in detail.  Patient expressed understanding regarding what to do in case of emergency\urgent symptoms  Please see AVS handed out to patient at the end of our visit for further patient instructions/ counseling done pertaining to today's office visit.   Return in about 4 months (around 04/20/2017) for compete physical, come fasting for bldwrk.     Note: This document was prepared using Dragon voice recognition software and may include unintentional  dictation errors.  David Aguilar 1:25 PM --------------------------------------------------------------------------------------------------------------------------------------------------------------------------------------------------------------------------------------------    Subjective:    CC:  Chief Complaint  Patient presents with  . Hypertension  . Hyperglycemia    HPI: David Aguilar is a 46 y.o. male who presents to [place] at [facility] today for issues as discussed below.   Seasonal allergies:  ok, needs script of flonas   RAD/asthma:    Sx stable.  Taking sinugulari and dulera and rarely needs albuterol rescue-- once q 3-61mo maybe.    HTN:   Not checking at home.  No exercise, or prudent diet.  Taking meds.  Tol well.  No sx/ complaints   Mood:    Right now pt is under a lot of stress in dealing with his wife and her extreme mood swings and how she treats the kids.  She swears at them and this upsets pt so much.   Finding it very difficult to enjoy any time with her at all.   With her for the kids essentially.   He is not sure what to do.   Couple counseling- she will not do as she doesn't acknowledge there is a problem    Obesity:   Diff to work on this with so much stress in his life right now. No exercise lately which he knows he needs to do for his "sanity", but has cut back on portions.   Not a candidate for wt loss meds per his ENDO doc- Dr Chalmers Cater.     -  But, he is down from prior--> 7lbs since 07/19/16-   GREAT JOB!        Wt Readings from Last 3 Encounters:  12/18/16 287 lb 6.4 oz (130.4 kg)  07/19/16 296 lb 1.6 oz (134.3 kg)  05/16/16 (!) 307 lb 3.2 oz (139.3 kg)   BP Readings from Last 3 Encounters:  12/18/16 109/69  07/19/16 108/76  05/16/16 125/83   Pulse Readings from Last 3 Encounters:  12/18/16 61  07/19/16 81  05/16/16 69   BMI Readings from Last 3 Encounters:  12/18/16 38.44 kg/m  07/19/16 39.61 kg/m    05/16/16 41.09 kg/m     Patient Care Team    Relationship Specialty Notifications Start End  Mellody Dance, DO PCP - General Family Medicine  01/03/16   Jacelyn Pi, MD Consulting Physician Endocrinology  08/20/15   Carolan Clines, MD Consulting Physician Urology  05/16/16      Patient Active Problem List   Diagnosis Date Noted  . Marital dysfunction 12/20/2016    Priority: High  . Under emotional stress due to marital discourse  12/20/2016    Priority: High  . Prediabetes 05/16/2016    Priority: High  . Essential hypertension 12/13/2012  Priority: High  . Obesity, Class III, BMI 40-49.9 (morbid obesity) ([place]) 12/12/2012    Priority: High  . Asthma 01/03/2016    Priority: Medium  . Hyperprolactinemia ([place]) 08/20/2015    Priority: Medium  . 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 Environmental and seasonal allergies 05/16/2016    Priority: Low  . Low testosterone 05/18/2015    Priority: Low  . Family history of colon cancer 12/13/2012    Priority: Low  . Counseling on health promotion and disease prevention 05/16/2016  . Bronchitis 03/22/2016  . Eczema 03/24/2015  . Cough 03/24/2015  . Achilles tendonitis 12/12/2012  . Allergic rhinitis 08/16/2012    Past Medical history, Surgical history, Family history, Social history, Allergies and Medications have been entered into the medical record, reviewed and changed as needed.    Current Meds  Medication Sig  . albuterol (PROVENTIL HFA;VENTOLIN HFA) 108 (90 Base) MCG/ACT inhaler Inhale 2 puffs into the lungs every 4 (four) hours as needed for wheezing (cough, shortness of breath or wheezing.).  Marland Kitchen atenolol-chlorthalidone (TENORETIC) 50-25 MG tablet Take 0.5 tablets by mouth daily.  . cabergoline (DOSTINEX) 0.5 MG tablet Take 1 tablet by mouth 2 (two) times a week.  . clobetasol cream (TEMOVATE) 0.05 % Apply topically 2 (two) times daily.  . clomiPHENE (CLOMID)  50 MG tablet Take 1 tablet by mouth daily.  . fluticasone (FLONASE) 50 MCG/ACT nasal spray Place 1 spray into both nostrils 2 (two) times daily.  . metFORMIN (GLUCOPHAGE-XR) 500 MG 24 hr tablet Take 1 tablet by mouth daily with supper.  . mometasone-formoterol (DULERA) 200-5 MCG/ACT AERO Inhale 2 puffs into the lungs daily.  . montelukast (SINGULAIR) 10 MG tablet Take 1 tablet (10 mg total) by mouth at bedtime.  . [DISCONTINUED] albuterol (PROVENTIL HFA;VENTOLIN HFA) 108 (90 BASE) MCG/ACT inhaler Inhale 2 puffs into the lungs every 4 (four) hours as needed for wheezing (cough, shortness of breath or wheezing.).  . [DISCONTINUED] atenolol-chlorthalidone (TENORETIC) 50-25 MG tablet Take 0.5 tablets by mouth daily.  . [DISCONTINUED] DULERA 200-5 MCG/ACT AERO INHALE 2 PUFFS INTO THE LUNGS 2 TIMES DAILY.  . [DISCONTINUED] fluticasone (FLONASE) 50 MCG/ACT nasal spray Place 2 sprays into both nostrils daily.  . [DISCONTINUED] montelukast (SINGULAIR) 10 MG tablet Take 1 tablet (10 mg total) by mouth at bedtime.    Allergies:  No Known Allergies   Review of Systems: General:   Denies fever, chills, unexplained weight loss.  Optho/Auditory:   Denies visual changes, blurred vision/LOV Respiratory:   Denies wheeze, DOE more than baseline levels.  Cardiovascular:   Denies chest pain, palpitations, new onset peripheral edema  Gastrointestinal:   Denies nausea, vomiting, diarrhea, abd pain.  Genitourinary: Denies dysuria, freq/ urgency, flank pain or discharge from genitals.  Endocrine:     Denies hot or cold intolerance, polyuria, polydipsia. Musculoskeletal:   Denies unexplained myalgias, joint swelling, unexplained arthralgias, gait problems.  Skin:  Denies new onset rash, suspicious lesions Neurological:     Denies dizziness, unexplained weakness, numbness  Psychiatric/Behavioral:   Denies mood changes, suicidal or homicidal ideations, hallucinations    Objective:   Blood pressure 109/69, pulse  61, height 6' 0.5" (1.842 m), weight 287 lb 6.4 oz (130.4 kg). Body mass index is 38.44 kg/m. General:  Well Developed, well nourished, appropriate for stated age.  Neuro:  Alert and oriented,  extra-ocular muscles intact  HEENT:  Normocephalic, atraumatic, neck supple, no carotid bruits appreciated  Skin:  no gross rash, warm, pink. Cardiac:  RRR, S1 S2 Respiratory:  ECTA B/L and A/P, Not using accessory muscles, speaking in full sentences- unlabored. Vascular:  Ext warm, no cyanosis apprec.; cap RF less 2 sec. Psych:  No HI/SI, judgement and insight good, Euthymic mood. Full Affect.

## 2016-12-20 DIAGNOSIS — R457 State of emotional shock and stress, unspecified: Secondary | ICD-10-CM | POA: Insufficient documentation

## 2016-12-20 DIAGNOSIS — Z630 Problems in relationship with spouse or partner: Secondary | ICD-10-CM | POA: Insufficient documentation

## 2016-12-20 NOTE — Assessment & Plan Note (Addendum)
Controlled-  No change tcxmnt plan  RF's given

## 2016-12-20 NOTE — Assessment & Plan Note (Addendum)
-   phenteramine and other wt loss meds- not candidate due to pituitary issues per Dr Chalmers Cater- Endo  - rec [place]  - start lose it and f/up if he'd like to discuss wt loss goals etc.  Pt thinks he will just cont on his own to slowly lose/ cut portions
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
## 2016-12-20 NOTE — Assessment & Plan Note (Addendum)
48mo ago- was 5.7 despite being on metformin for pituitary adenoma per pt-- NOT on it for pre-DM  Wt loss & Exercise counseling    reck q 27mo recommended

## 2016-12-23 NOTE — Assessment & Plan Note (Signed)
Well controlled on daily meds- rarely uses albuterol  RF given, cont meds

## 2016-12-23 NOTE — Assessment & Plan Note (Signed)
Per Dr Chalmers Cater
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
## 2016-12-23 NOTE — Assessment & Plan Note (Signed)
flonase  Rec sinus rinses BID

## 2017-01-29 ENCOUNTER — Encounter: Payer: Self-pay | Admitting: Adult Health

## 2017-01-29 ENCOUNTER — Ambulatory Visit (INDEPENDENT_AMBULATORY_CARE_PROVIDER_SITE_OTHER): Payer: BLUE CROSS/BLUE SHIELD | Admitting: Adult Health

## 2017-01-29 DIAGNOSIS — H6591 Unspecified nonsuppurative otitis media, right ear: Secondary | ICD-10-CM | POA: Insufficient documentation

## 2017-01-29 DIAGNOSIS — H938X1 Other specified disorders of right ear: Secondary | ICD-10-CM | POA: Insufficient documentation

## 2017-01-29 MED ORDER — FLUTICASONE PROPIONATE 50 MCG/ACT NA SUSP: 2.0000 | Freq: Every day | NASAL | 6 refills | Status: DC
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
## 2017-01-29 NOTE — Patient Instructions (Addendum)

## 2017-01-29 NOTE — Assessment & Plan Note (Signed)
Flonase  OTC Antihistamine daily for 7-10 days. Increase water to at least 1 gallon/day.

## 2017-02-13 DIAGNOSIS — F4323 Adjustment disorder with mixed anxiety and depressed mood: Secondary | ICD-10-CM | POA: Diagnosis not present

## 2017-03-28 ENCOUNTER — Ambulatory Visit (INDEPENDENT_AMBULATORY_CARE_PROVIDER_SITE_OTHER): Payer: BLUE CROSS/BLUE SHIELD | Admitting: Adult Health

## 2017-03-28 ENCOUNTER — Encounter: Payer: Self-pay | Admitting: Adult Health

## 2017-03-28 ENCOUNTER — Ambulatory Visit: Payer: BLUE CROSS/BLUE SHIELD

## 2017-03-28 VITALS — BP 112/78 | HR 93 | Ht 72.5 in | Wt 290.0 lb

## 2017-03-28 DIAGNOSIS — M25571 Pain in right ankle and joints of right foot: Secondary | ICD-10-CM | POA: Diagnosis not present

## 2017-03-28 MED ORDER — MELOXICAM 15 MG PO TABS: 15.0000 mg | ORAL_TABLET | Freq: Every day | ORAL | 0 refills | Status: DC

## 2017-03-28 NOTE — Assessment & Plan Note (Signed)
Xray completed. Uric acid level drawn. Meloxicam 15mg  provided and Gout Treatment information sheet provided. Will call when results are available.
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
## 2017-03-28 NOTE — Patient Instructions (Signed)
Gout Gout is painful swelling that can occur in some of your joints. Gout is a type of arthritis. This condition is caused by having too much uric acid in your body. Uric acid is a chemical that forms when your body breaks down substances called purines. Purines are important for building body proteins. When your body has too much uric acid, sharp crystals can form and build up inside your joints. This causes pain and swelling. Gout attacks can happen quickly and be very painful (acute gout). Over time, the attacks can affect more joints and become more frequent (chronic gout). Gout can also cause uric acid to build up under your skin and inside your kidneys. What are the causes? This condition is caused by too much uric acid in your blood. This can occur because:  Your kidneys do not remove enough uric acid from your blood. This is the most common cause.  Your body makes too much uric acid. This can occur with some cancers and cancer treatments. It can also occur if your body is breaking down too many red blood cells (hemolytic anemia).  You eat too many foods that are high in purines. These foods include organ meats and some seafood. Alcohol, especially beer, is also high in purines.  A gout attack may be triggered by trauma or stress. What increases the risk? This condition is more likely to develop in people who:  Have a family history of gout.  Are male and middle-aged.  Are male and have gone through menopause.  Are obese.  Frequently drink alcohol, especially beer.  Are dehydrated.  Lose weight too quickly.  Have an organ transplant.  Have lead poisoning.  Take certain medicines, including aspirin, cyclosporine, diuretics, levodopa, and niacin.  Have kidney disease or psoriasis.  What are the signs or symptoms? An attack of acute gout happens quickly. It usually occurs in just one joint. The most common place is the big toe. Attacks often start at night. Other joints  that may be affected include joints of the feet, ankle, knee, fingers, wrist, or elbow. Symptoms may include:  Severe pain.  Warmth.  Swelling.  Stiffness.  Tenderness. The affected joint may be very painful to touch.  Shiny, red, or purple skin.  Chills and fever.  Chronic gout may cause symptoms more frequently. More joints may be involved. You may also have white or yellow lumps (tophi) on your hands or feet or in other areas near your joints. How is this diagnosed? This condition is diagnosed based on your symptoms, medical history, and physical exam. You may have tests, such as:  Blood tests to measure uric acid levels.  Removal of joint fluid with a needle (aspiration) to look for uric acid crystals.  X-rays to look for joint damage.  How is this treated? Treatment for this condition has two phases: treating an acute attack and preventing future attacks. Acute gout treatment may include medicines to reduce pain and swelling, including:  NSAIDs.  Steroids. These are strong anti-inflammatory medicines that can be taken by mouth (orally) or injected into a joint.  Colchicine. This medicine relieves pain and swelling when it is taken soon after an attack. It can be given orally or through an IV tube.  Preventive treatment may include:  Daily use of smaller doses of NSAIDs or colchicine.  Use of a medicine that reduces uric acid levels in your blood.  Changes to your diet. You may need to see a specialist about healthy eating (dietitian).  Follow these instructions at home: During a Gout Attack  If directed, apply ice to the affected area: ? Put ice in a plastic bag. ? Place a towel between your skin and the bag. ? Leave the ice on for 20 minutes, 2-3 times a day.  Rest the joint as much as possible. If the affected joint is in your leg, you may be given crutches to use.  Raise (elevate) the affected joint above the level of your heart as often as  possible.  Drink enough fluids to keep your urine clear or pale yellow.  Take over-the-counter and prescription medicines only as told by your health care provider.  Do not drive or operate heavy machinery while taking prescription pain medicine.  Follow instructions from your health care provider about eating or drinking restrictions.  Return to your normal activities as told by your health care provider. Ask your health care provider what activities are safe for you. Avoiding Future Gout Attacks  Follow a low-purine diet as told by your dietitian or health care provider. Avoid foods and drinks that are high in purines, including liver, kidney, anchovies, asparagus, herring, mushrooms, mussels, and beer.  Limit alcohol intake to no more than 1 drink a day for nonpregnant women and 2 drinks a day for men. One drink equals 12 oz of beer, 5 oz of wine, or 1 oz of hard liquor.  Maintain a healthy weight or lose weight if you are overweight. If you want to lose weight, talk with your health care provider. It is important that you do not lose weight too quickly.  Start or maintain an exercise program as told by your health care provider.  Drink enough fluids to keep your urine clear or pale yellow.  Take over-the-counter and prescription medicines only as told by your health care provider.  Keep all follow-up visits as told by your health care provider. This is important. Contact a health care provider if:  You have another gout attack.  You continue to have symptoms of a gout attack after10 days of treatment.  You have side effects from your medicines.  You have chills or a fever.  You have burning pain when you urinate.  You have pain in your lower back or belly. Get help right away if:  You have severe or uncontrolled pain.  You cannot urinate. This information is not intended to replace advice given to you by your health care provider. Make sure you discuss any questions  you have with your health care provider. Document Released: 07/14/2000 Document Revised: 12/23/2015 Document Reviewed: 04/29/2015 Elsevier Interactive Patient Education  2017 [place] are compounds that affect the level of uric acid in your body. A low-purine diet is a diet that is low in purines. Eating a low-purine diet can prevent the level of uric acid in your body from getting too high and causing gout or kidney stones or both. What do I need to know about this diet?  Choose low-purine foods. Examples of low-purine foods are listed in the next section.  Drink plenty of fluids, especially water. Fluids can help remove uric acid from your body. Try to drink 8-16 cups (1.9-3.8 L) a day.  Limit foods high in fat, especially saturated fat, as fat makes it harder for the body to get rid of uric acid. Foods high in saturated fat include pizza, cheese, ice cream, whole milk, fried foods, and gravies. Choose foods that are lower in fat and lean  sources of protein. Use olive oil when cooking as it contains healthy fats that are not high in saturated fat.  Limit alcohol. Alcohol interferes with the elimination of uric acid from your body. If you are having a gout attack, avoid all alcohol.  Keep in mind that different people's bodies react differently to different foods. You will probably learn over time which foods do or do not affect you. If you discover that a food tends to cause your gout to flare up, avoid eating that food. You can more freely enjoy foods that do not cause problems. If you have any questions about a food item, talk to your dietitian or health care provider. Which foods are low, moderate, and high in purines? The following is a list of foods that are low, moderate, and high in purines. You can eat any amount of the foods that are low in purines. You may be able to have small amounts of foods that are moderate in purines. Ask your health care provider  how much of a food moderate in purines you can have. Avoid foods high in purines. Grains  Foods low in purines: Enriched white bread, pasta, rice, cake, cornbread, popcorn.  Foods moderate in purines: Whole-grain breads and cereals, wheat germ, bran, oatmeal. Uncooked oatmeal. Dry wheat bran or wheat germ.  Foods high in purines: Pancakes, [place] toast, biscuits, muffins. Vegetables  Foods low in purines: All vegetables, except those that are moderate in purines.  Foods moderate in purines: Asparagus, cauliflower, spinach, mushrooms, green peas. Fruits  All fruits are low in purines. Meats and other Protein Foods  Foods low in purines: Eggs, nuts, peanut butter.  Foods moderate in purines: 80-90% lean beef, lamb, veal, pork, poultry, fish, eggs, peanut butter, nuts. Crab, lobster, oysters, and shrimp. Cooked dried beans, peas, and lentils.  Foods high in purines: Anchovies, sardines, herring, mussels, tuna, codfish, scallops, trout, and haddock. Berniece Salines. Organ meats (such as liver or kidney). Tripe. Game meat. Goose. Sweetbreads. Dairy  All dairy foods are low in purines. Low-fat and fat-free dairy products are best because they are low in saturated fat. Beverages  Drinks low in purines: Water, carbonated beverages, tea, coffee, cocoa.  Drinks moderate in purines: Soft drinks and other drinks sweetened with high-fructose corn syrup. Juices. To find whether a food or drink is sweetened with high-fructose corn syrup, look at the ingredients list.  Drinks high in purines: Alcoholic beverages (such as beer). Condiments  Foods low in purines: Salt, herbs, olives, pickles, relishes, vinegar.  Foods moderate in purines: Butter, margarine, oils, mayonnaise. Fats and Oils  Foods low in purines: All types, except gravies and sauces made with meat.  Foods high in purines: Gravies and sauces made with meat. Other Foods  Foods low in purines: Sugars, sweets, gelatin. Cake. Soups made  without meat.  Foods moderate in purines: Meat-based or fish-based soups, broths, or bouillons. Foods and drinks sweetened with high-fructose corn syrup.  Foods high in purines: High-fat desserts (such as ice cream, cookies, cakes, pies, doughnuts, and chocolate). Contact your dietitian for more information on foods that are not listed here. This information is not intended to replace advice given to you by your health care provider. Make sure you discuss any questions you have with your health care provider. Document Released: 11/11/2010 Document Revised: 12/23/2015 Document Reviewed: 06/23/2013 Elsevier Interactive Patient Education  2017 [employer].  Please follow the directions as listed above. Please take Meloxicam as directed. We will call you with you when  labs and xray results are available. Please call clinic with any questions/concerns. FEEL BETTER!

## 2017-03-29 ENCOUNTER — Telehealth: Payer: Self-pay

## 2017-03-29 ENCOUNTER — Other Ambulatory Visit: Payer: Self-pay | Admitting: Adult Health

## 2017-03-29 ENCOUNTER — Telehealth: Payer: Self-pay | Admitting: Family Medicine

## 2017-03-29 DIAGNOSIS — M109 Gout, unspecified: Secondary | ICD-10-CM

## 2017-03-29 LAB — URIC ACID: Uric Acid: 10.1 mg/dL — ABNORMAL HIGH (ref 3.7–8.6)

## 2017-03-29 MED ORDER — COLCHICINE 0.6 MG PO CAPS: ORAL_CAPSULE | ORAL | 0 refills | Status: DC

## 2017-03-29 NOTE — Telephone Encounter (Signed)
Rcvd call from [facility]  Radiology needs order for patient changed from ankle to FOOT . --pls call her at [phone]  --glh

## 2017-03-29 NOTE — Addendum Note (Signed)
Addended by: Lanier Prude D on: 03/29/2017 04:17 PM   Modules accepted: Orders

## 2017-03-29 NOTE — Progress Notes (Signed)
Uric Acid level indicates Gout. Sent in Colchicine

## 2017-03-29 NOTE — Telephone Encounter (Signed)
Received fax from pharmacy stating that colchicine requires PA with pt's insurance company.  Spoke with pharmacy and advised them to change to [place] for insurance coverage.  Pharmacy stated that it is covered and pt has a $50 copay.  Pt informed.  Charyl Bigger, CMA

## 2017-04-06 ENCOUNTER — Telehealth: Payer: Self-pay

## 2017-04-06 ENCOUNTER — Encounter (HOSPITAL_COMMUNITY): Payer: Self-pay | Admitting: Emergency Medicine

## 2017-04-06 ENCOUNTER — Emergency Department (HOSPITAL_COMMUNITY)
Admission: EM | Admit: 2017-04-06 | Discharge: 2017-04-06 | Disposition: A | Discharge status: Home / Self Care | Payer: BLUE CROSS/BLUE SHIELD | Attending: Emergency Medicine | Admitting: Emergency Medicine

## 2017-04-06 DIAGNOSIS — I10 Essential (primary) hypertension: Secondary | ICD-10-CM | POA: Diagnosis not present

## 2017-04-06 DIAGNOSIS — R0602 Shortness of breath: Secondary | ICD-10-CM | POA: Diagnosis not present

## 2017-04-06 DIAGNOSIS — R202 Paresthesia of skin: Secondary | ICD-10-CM | POA: Insufficient documentation

## 2017-04-06 DIAGNOSIS — Z7984 Long term (current) use of oral hypoglycemic drugs: Secondary | ICD-10-CM | POA: Diagnosis not present

## 2017-04-06 DIAGNOSIS — L509 Urticaria, unspecified: Secondary | ICD-10-CM | POA: Diagnosis not present

## 2017-04-06 DIAGNOSIS — T63441A Toxic effect of venom of bees, accidental (unintentional), initial encounter: Secondary | ICD-10-CM | POA: Diagnosis not present

## 2017-04-06 DIAGNOSIS — Z79899 Other long term (current) drug therapy: Secondary | ICD-10-CM | POA: Diagnosis not present

## 2017-04-06 DIAGNOSIS — R21 Rash and other nonspecific skin eruption: Secondary | ICD-10-CM | POA: Diagnosis not present

## 2017-04-06 DIAGNOSIS — J45909 Unspecified asthma, uncomplicated: Secondary | ICD-10-CM | POA: Diagnosis not present

## 2017-04-06 DIAGNOSIS — T7840XA Allergy, unspecified, initial encounter: Secondary | ICD-10-CM | POA: Diagnosis not present

## 2017-04-06 MED ORDER — FAMOTIDINE IN NACL 20-0.9 MG/50ML-% IV SOLN
20.0000 mg | Freq: Once | INTRAVENOUS | Status: AC
  Administered 2017-04-06: 20 mg via INTRAVENOUS
  Filled 2017-04-06: qty 50

## 2017-04-06 MED ORDER — EPINEPHRINE 0.3 MG/0.3ML IJ SOAJ
0.3000 mg | Freq: Once | INTRAMUSCULAR | 1 refills | Status: AC
Start: 2017-04-06 — End: 2017-04-06

## 2017-04-06 MED ORDER — PREDNISONE 20 MG PO TABS: ORAL_TABLET | ORAL | 0 refills | Status: DC

## 2017-04-06 MED ORDER — SODIUM CHLORIDE 0.9 % IV BOLUS (SEPSIS)
1000.0000 mL | Freq: Once | INTRAVENOUS | Status: AC
  Administered 2017-04-06: 1000 mL via INTRAVENOUS

## 2017-04-06 MED ORDER — PREDNISONE 20 MG PO TABS
60.0000 mg | ORAL_TABLET | Freq: Once | ORAL | Status: AC
  Administered 2017-04-06: 60 mg via ORAL
  Filled 2017-04-06: qty 3

## 2017-04-06 NOTE — Telephone Encounter (Signed)
Pt called and spoke to Annabell Sabal.  Pt stated that he was just stung by 3 bees.  Pt c/o "feeling funny", sweating and feels that his face is swelling.  Advised Annabell Sabal to instruct pt that this is an emergency situation that cannot be handled in our office and that the patient should call 9-1-1 immediately.  Per Annabell Sabal, pt expressed understanding and is agreeable.  Charyl Bigger, CMA
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
## 2017-04-06 NOTE — ED Notes (Signed)
Pt verbalized understanding discharge instructions and denies any further needs or questions at this time. VS stable, ambulatory and steady gait.   

## 2017-04-06 NOTE — ED Triage Notes (Signed)
Per pt, from home, pulled up a tarp and was stung by yellowjackets x4-5 times. L hand, R neck, R ankle, L leg Pt broke out into a rash within ten minutes, states his face was numb. Went to [facility] took his own benadryl, was given epi pen by ems. EMS gave 50 benadryl, 0.3 epi, 500 cc fluids. Pt is AAOX4, no resp or GI compromise. VSS. BP was initally 90/50, now 115/70. Pt still has rash around body, face is red.

## 2017-04-20 ENCOUNTER — Other Ambulatory Visit: Payer: Self-pay | Admitting: Family Medicine

## 2017-04-20 DIAGNOSIS — I10 Essential (primary) hypertension: Secondary | ICD-10-CM

## 2017-04-24 DIAGNOSIS — F4323 Adjustment disorder with mixed anxiety and depressed mood: Secondary | ICD-10-CM | POA: Diagnosis not present

## 2017-04-27 ENCOUNTER — Encounter: Payer: Self-pay | Admitting: Family Medicine

## 2017-04-27 ENCOUNTER — Ambulatory Visit (INDEPENDENT_AMBULATORY_CARE_PROVIDER_SITE_OTHER): Payer: BLUE CROSS/BLUE SHIELD | Admitting: Family Medicine

## 2017-04-27 VITALS — BP 111/78 | HR 72 | Ht 72.0 in | Wt 285.9 lb

## 2017-04-27 DIAGNOSIS — Z719 Counseling, unspecified: Secondary | ICD-10-CM

## 2017-04-27 DIAGNOSIS — Z0000 Encounter for general adult medical examination without abnormal findings: Secondary | ICD-10-CM

## 2017-04-27 DIAGNOSIS — E221 Hyperprolactinemia: Secondary | ICD-10-CM

## 2017-04-27 DIAGNOSIS — E559 Vitamin D deficiency, unspecified: Secondary | ICD-10-CM

## 2017-04-27 DIAGNOSIS — R7989 Other specified abnormal findings of blood chemistry: Secondary | ICD-10-CM | POA: Diagnosis not present

## 2017-04-27 DIAGNOSIS — D443 Neoplasm of uncertain behavior of pituitary gland: Secondary | ICD-10-CM | POA: Diagnosis not present

## 2017-04-27 DIAGNOSIS — E66813 Obesity, class 3: Secondary | ICD-10-CM

## 2017-04-27 DIAGNOSIS — Z1389 Encounter for screening for other disorder: Secondary | ICD-10-CM | POA: Diagnosis not present
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
## 2017-04-27 NOTE — Patient Instructions (Signed)
Preventive Care for Adults, Male A healthy lifestyle and preventive care can promote health and wellness. Preventive health guidelines for men include the following key practices:  A routine yearly physical is a good way to check with your health care provider about your health and preventative screening. It is a chance to share any concerns and updates on your health and to receive a thorough exam.  Visit your dentist for a routine exam and preventative care every 6 months. Brush your teeth twice a day and floss once a day. Good oral hygiene prevents tooth decay and gum disease.  The frequency of eye exams is based on your age, health, family medical history, use of contact lenses, and other factors. Follow your health care provider's recommendations for frequency of eye exams.  Eat a healthy diet. Foods such as vegetables, fruits, whole grains, low-fat dairy products, and lean protein foods contain the nutrients you need without too many calories. Decrease your intake of foods high in solid fats, added sugars, and salt. Eat the right amount of calories for you.Get information about a proper diet from your health care provider, if necessary.  Regular physical exercise is one of the most important things you can do for your health. Most adults should get at least 150 minutes of moderate-intensity exercise (any activity that increases your heart rate and causes you to sweat) each week. In addition, most adults need muscle-strengthening exercises on 2 or more days a week.  Maintain a healthy weight. The body mass index (BMI) is a screening tool to identify possible weight problems. It provides an estimate of body fat based on height and weight. Your health care provider can find your BMI and can help you achieve or maintain a healthy weight.For adults 20 years and older:  A BMI below 18.5 is considered underweight.  A BMI of 18.5 to 24.9 is normal.  A BMI of 25 to 29.9 is considered  overweight.  A BMI of 30 and above is considered obese.  Maintain normal blood lipids and cholesterol levels by exercising and minimizing your intake of saturated fat. Eat a balanced diet with plenty of fruit and vegetables. Blood tests for lipids and cholesterol should begin at age 55 and be repeated every 5 years. If your lipid or cholesterol levels are high, you are over 50, or you are at high risk for heart disease, you may need your cholesterol levels checked more frequently.Ongoing high lipid and cholesterol levels should be treated with medicines if diet and exercise are not working.  If you smoke, find out from your health care provider how to quit. If you do not use tobacco, do not start.  Lung cancer screening is recommended for adults aged 58-80 years who are at high risk for developing lung cancer because of a history of smoking. A yearly low-dose CT scan of the lungs is recommended for people who have at least a 30-pack-year history of smoking and are a current smoker or have quit within the past 15 years. A pack year of smoking is smoking an average of 1 pack of cigarettes a day for 1 year (for example: 1 pack a day for 30 years or 2 packs a day for 15 years). Yearly screening should continue until the smoker has stopped smoking for at least 15 years. Yearly screening should be stopped for people who develop a health problem that would prevent them from having lung cancer treatment.  If you choose to drink alcohol, do not have more  than 2 drinks per day. One drink is considered to be 12 ounces (355 mL) of beer, 5 ounces (148 mL) of wine, or 1.5 ounces (44 mL) of liquor.  Avoid use of street drugs. Do not share needles with anyone. Ask for help if you need support or instructions about stopping the use of drugs.  High blood pressure causes heart disease and increases the risk of stroke. Your blood pressure should be checked at least every 1-2 years. Ongoing high blood pressure should be  treated with medicines, if weight loss and exercise are not effective.  If you are 34-90 years old, ask your health care provider if you should take aspirin to prevent heart disease.  Diabetes screening is done by taking a blood sample to check your blood glucose level after you have not eaten for a certain period of time (fasting). If you are not overweight and you do not have risk factors for diabetes, you should be screened once every 3 years starting at age 35. If you are overweight or obese and you are 70-84 years of age, you should be screened for diabetes every year as part of your cardiovascular risk assessment.  Colorectal cancer can be detected and often prevented. Most routine colorectal cancer screening begins at the age of 18 and continues through age 69. However, your health care provider may recommend screening at an earlier age if you have risk factors for colon cancer. On a yearly basis, your health care provider may provide home test kits to check for hidden blood in the stool. Use of a small camera at the end of a tube to directly examine the colon (sigmoidoscopy or colonoscopy) can detect the earliest forms of colorectal cancer. Talk to your health care provider about this at age 71, when routine screening begins. Direct exam of the colon should be repeated every 5-10 years through age 18, unless early forms of precancerous polyps or small growths are found.  People who are at an increased risk for hepatitis B should be screened for this virus. You are considered at high risk for hepatitis B if:  You were born in a country where hepatitis B occurs often. Talk with your health care provider about which countries are considered high risk.  Your parents were born in a high-risk country and you have not received a shot to protect against hepatitis B (hepatitis B vaccine).  You have HIV or AIDS.  You use needles to inject street drugs.  You live with, or have sex with, someone who  has hepatitis B.  You are a man who has sex with other men (MSM).  You get hemodialysis treatment.  You take certain medicines for conditions such as cancer, organ transplantation, and autoimmune conditions.  Hepatitis C blood testing is recommended for all people born from [birth year] through 1965 and any individual with known risks for hepatitis C.  Practice safe sex. Use condoms and avoid high-risk sexual practices to reduce the spread of sexually transmitted infections (STIs). STIs include gonorrhea, chlamydia, syphilis, trichomonas, herpes, HPV, and human immunodeficiency virus (HIV). Herpes, HIV, and HPV are viral illnesses that have no cure. They can result in disability, cancer, and death.  If you are a man who has sex with other men, you should be screened at least once per year for:  HIV.  Urethral, rectal, and pharyngeal infection of gonorrhea, chlamydia, or both.  If you are at risk of being infected with HIV, it is recommended that you take a  prescription medicine daily to prevent HIV infection. This is called preexposure prophylaxis (PrEP). You are considered at risk if:  You are a man who has sex with other men (MSM) and have other risk factors.  You are a heterosexual man, are sexually active, and are at increased risk for HIV infection.  You take drugs by injection.  You are sexually active with a partner who has HIV.  Talk with your health care provider about whether you are at high risk of being infected with HIV. If you choose to begin PrEP, you should first be tested for HIV. You should then be tested every 3 months for as long as you are taking PrEP.  A one-time screening for abdominal aortic aneurysm (AAA) and surgical repair of large AAAs by ultrasound are recommended for men ages 44 to 66 years who are current or former smokers.  Healthy men should no longer receive prostate-specific antigen (PSA) blood tests as part of routine cancer screening. Talk with your health  care provider about prostate cancer screening.  Testicular cancer screening is not recommended for adult males who have no symptoms. Screening includes self-exam, a health care provider exam, and other screening tests. Consult with your health care provider about any symptoms you have or any concerns you have about testicular cancer.  Use sunscreen. Apply sunscreen liberally and repeatedly throughout the day. You should seek shade when your shadow is shorter than you. Protect yourself by wearing long sleeves, pants, a wide-brimmed hat, and sunglasses year round, whenever you are outdoors.  Once a month, do a whole-body skin exam, using a mirror to look at the skin on your back. Tell your health care provider about new moles, moles that have irregular borders, moles that are larger than a pencil eraser, or moles that have changed in shape or color.  Stay current with required vaccines (immunizations).  Influenza vaccine. All adults should be immunized every year.  Tetanus, diphtheria, and acellular pertussis (Td, Tdap) vaccine. An adult who has not previously received Tdap or who does not know his vaccine status should receive 1 dose of Tdap. This initial dose should be followed by tetanus and diphtheria toxoids (Td) booster doses every 10 years. Adults with an unknown or incomplete history of completing a 3-dose immunization series with Td-containing vaccines should begin or complete a primary immunization series including a Tdap dose. Adults should receive a Td booster every 10 years.  Varicella vaccine. An adult without evidence of immunity to varicella should receive 2 doses or a second dose if he has previously received 1 dose.  Human papillomavirus (HPV) vaccine. Males aged 11-21 years who have not received the vaccine previously should receive the 3-dose series. Males aged 22-26 years may be immunized. Immunization is recommended through the age of 23 years for any male who has sex with males  and did not get any or all doses earlier. Immunization is recommended for any person with an immunocompromised condition through the age of 72 years if he did not get any or all doses earlier. During the 3-dose series, the second dose should be obtained 4-8 weeks after the first dose. The third dose should be obtained 24 weeks after the first dose and 16 weeks after the second dose.  Zoster vaccine. One dose is recommended for adults aged 23 years or older unless certain conditions are present.  Measles, mumps, and rubella (MMR) vaccine. Adults born before [birth year] generally are considered immune to measles and mumps. Adults born in [birth year]  or later should have 1 or more doses of MMR vaccine unless there is a contraindication to the vaccine or there is laboratory evidence of immunity to each of the three diseases. A routine second dose of MMR vaccine should be obtained at least 28 days after the first dose for students attending postsecondary schools, health care workers, or international travelers. People who received inactivated measles vaccine or an unknown type of measles vaccine during 1963-1967 should receive 2 doses of MMR vaccine. People who received inactivated mumps vaccine or an unknown type of mumps vaccine before 1979 and are at high risk for mumps infection should consider immunization with 2 doses of MMR vaccine. Unvaccinated health care workers born before [birth year] who lack laboratory evidence of measles, mumps, or rubella immunity or laboratory confirmation of disease should consider measles and mumps immunization with 2 doses of MMR vaccine or rubella immunization with 1 dose of MMR vaccine.  Pneumococcal 13-valent conjugate (PCV13) vaccine. When indicated, a person who is uncertain of his immunization history and has no record of immunization should receive the PCV13 vaccine. All adults 73 years of age and older should receive this vaccine. An adult aged 43 years or older who has certain medical  conditions and has not been previously immunized should receive 1 dose of PCV13 vaccine. This PCV13 should be followed with a dose of pneumococcal polysaccharide (PPSV23) vaccine. Adults who are at high risk for pneumococcal disease should obtain the PPSV23 vaccine at least 8 weeks after the dose of PCV13 vaccine. Adults older than 47 years of age who have normal immune system function should obtain the PPSV23 vaccine dose at least 1 year after the dose of PCV13 vaccine.  Pneumococcal polysaccharide (PPSV23) vaccine. When PCV13 is also indicated, PCV13 should be obtained first. All adults aged 43 years and older should be immunized. An adult younger than age 68 years who has certain medical conditions should be immunized. Any person who resides in a nursing home or long-term care facility should be immunized. An adult smoker should be immunized. People with an immunocompromised condition and certain other conditions should receive both PCV13 and PPSV23 vaccines. People with human immunodeficiency virus (HIV) infection should be immunized as soon as possible after diagnosis. Immunization during chemotherapy or radiation therapy should be avoided. Routine use of PPSV23 vaccine is not recommended for American Indians, [place] Natives, or people younger than 65 years unless there are medical conditions that require PPSV23 vaccine. When indicated, people who have unknown immunization and have no record of immunization should receive PPSV23 vaccine. One-time revaccination 5 years after the first dose of PPSV23 is recommended for people aged 19-64 years who have chronic kidney failure, nephrotic syndrome, asplenia, or immunocompromised conditions. People who received 1-2 doses of PPSV23 before age 62 years should receive another dose of PPSV23 vaccine at age 35 years or later if at least 5 years have passed since the previous dose. Doses of PPSV23 are not needed for people immunized with PPSV23 at or after age 34  years.  Meningococcal vaccine. Adults with asplenia or persistent complement component deficiencies should receive 2 doses of quadrivalent meningococcal conjugate (MenACWY-D) vaccine. The doses should be obtained at least 2 months apart. Microbiologists working with certain meningococcal bacteria, [place] recruits, people at risk during an outbreak, and people who travel to or live in countries with a high rate of meningitis should be immunized. A first-year college student up through age 66 years who is living in a residence hall should receive a  dose if he did not receive a dose on or after his 16th birthday. Adults who have certain high-risk conditions should receive one or more doses of vaccine.  Hepatitis A vaccine. Adults who wish to be protected from this disease, have chronic liver disease, work with hepatitis A-infected animals, work in hepatitis A research labs, or travel to or work in countries with a high rate of hepatitis A should be immunized. Adults who were previously unvaccinated and who anticipate close contact with an international adoptee during the first 60 days after arrival in the [place] from a country with a high rate of hepatitis A should be immunized.  Hepatitis B vaccine. Adults should be immunized if they wish to be protected from this disease, are under age 77 years and have diabetes, have chronic liver disease, have had more than one sex partner in the past 6 months, may be exposed to blood or other infectious body fluids, are household contacts or sex partners of hepatitis B positive people, are clients or workers in certain care facilities, or travel to or work in countries with a high rate of hepatitis B.  Haemophilus influenzae type b (Hib) vaccine. A previously unvaccinated person with asplenia or sickle cell disease or having a scheduled splenectomy should receive 1 dose of Hib vaccine. Regardless of previous immunization, a recipient of a hematopoietic stem cell  transplant should receive a 3-dose series 6-12 months after his successful transplant. Hib vaccine is not recommended for adults with HIV infection. Preventive Service / Frequency Ages 52 to 86  Blood pressure check.** / Every year.  Lipid and cholesterol check.** / Every 5 years beginning at age 40.  Lung cancer screening. / Every year if you are aged 15-80 years and have a 30-pack-year history of smoking and currently smoke or have quit within the past 15 years. Yearly screening is stopped once you have quit smoking for at least 15 years or develop a health problem that would prevent you from having lung cancer treatment.  Fecal occult blood test (FOBT) of stool. / Every year beginning at age 52 and continuing until age 29. You may not have to do this test if you get a colonoscopy every 10 years.  Flexible sigmoidoscopy** or colonoscopy.** / Every 5 years for a flexible sigmoidoscopy or every 10 years for a colonoscopy beginning at age 69 and continuing until age 52.  Hepatitis C blood test.** / For all people born from [birth year] through 1965 and any individual with known risks for hepatitis C.  Skin self-exam. / Monthly.  Influenza vaccine. / Every year.  Tetanus, diphtheria, and acellular pertussis (Tdap/Td) vaccine.** / Consult your health care provider. 1 dose of Td every 10 years.  Varicella vaccine.** / Consult your health care provider.  Zoster vaccine.** / 1 dose for adults aged 64 years or older.  Measles, mumps, rubella (MMR) vaccine.** / You need at least 1 dose of MMR if you were born in 1957 or later. You may also need a second dose.  Pneumococcal 13-valent conjugate (PCV13) vaccine.** / Consult your health care provider.  Pneumococcal polysaccharide (PPSV23) vaccine.** / 1 to 2 doses if you smoke cigarettes or if you have certain conditions.  Meningococcal vaccine.** / Consult your health care provider.  Hepatitis A vaccine.** / Consult your health care  provider.  Hepatitis B vaccine.** / Consult your health care provider.  Haemophilus influenzae type b (Hib) vaccine.** / Consult your health care provider.  **Family history and personal history of risk  and conditions may change your health care provider's recommendations.   This information is not intended to replace advice given to you by your health care provider. Make sure you discuss any questions you have with your health care provider.   Document Released: 09/12/2001 Document Revised: 08/07/2014 Document Reviewed: 12/12/2010 Elsevier Interactive Patient Education 2016 [place] for Adults  A healthy lifestyle and preventive care can promote health and wellness. Preventive health guidelines for men include the following key practices:  .   A routine yearly physical is a good way to check with your health care provider about your health and preventative screening. It is a chance to share any concerns and updates on your health and to receive a thorough exam.  .  Visit your dentist for a routine exam and preventative care every 6 months. Brush your teeth twice a day and floss once a day. Good oral hygiene prevents tooth decay and gum disease.  .  The frequency of eye exams is based on your age, health, family medical history, use of contact lenses, and other factors.  Follow your health care provider's recommendations for frequency of eye exams.  .  Eat a healthy diet.  Foods such as vegetables, fruits, whole grains, low-fat dairy products, and lean protein foods contain the nutrients you need without too many calories.  Decrease your intake of foods high in solid fats, added sugars, and salt.  Eat the right amount of calories for you.  Get information about a proper diet from your health care provider, if necessary.  .  Regular physical exercise is one of the most important things you can do for your health.  Most adults should get at least 150 minutes of  moderate-intensity exercise (any activity that increases your heart rate and causes you to sweat) each week.  In addition, most adults need muscle-strengthening exercises on 2 or more days a week.  .  Maintain a healthy weight. The body mass index (BMI) is a screening tool to identify possible weight problems. It provides an estimate of body fat based on height and weight. Your health care provider can find your BMI and can help you achieve or maintain a healthy weight. For adults 20 years and older: A BMI below 18.5 is considered underweight. A BMI of 18.5 to 24.9 is normal. A BMI of 25 to 29.9 is considered overweight. A BMI of 30 and above is considered obese.  .  Maintain normal blood lipids and cholesterol levels by exercising and minimizing your intake of saturated fat. Eat a balanced diet with plenty of fruit and vegetables. Blood tests for lipids and cholesterol should begin at age 56 and be repeated every 5 years. If your lipid or cholesterol levels are high, you are over 50, or you are at high risk for heart disease, you may need your cholesterol levels checked more frequently. Ongoing high lipid and cholesterol levels should be treated with medicines if diet and exercise are not working.  .  If you smoke, find out from your health care provider how to quit. If you do not use tobacco, do not start.  . If you choose to drink alcohol, do not have more than 2 drinks per day. One drink is considered to be 12 ounces (355 mL) of beer, 5 ounces (148 mL) of wine, or 1.5 ounces (44 mL) of liquor.  [place] Avoid use of street drugs. Do not share needles with anyone. Ask for help if you  need support or instructions about stopping the use of drugs.  . High blood pressure causes heart disease and increases the risk of stroke. Your blood pressure should be checked at least every 1-2 years. Ongoing high blood pressure should be treated with medicines, if weight loss and exercise are not  effective.  . If you are 90-16 years old, ask your health care provider if you should take aspirin to prevent heart disease.  . Diabetes screening involves taking a blood sample to check your fasting blood sugar level.  This should be done once every 3 years, after age 83, if you are within normal weight and without risk factors for diabetes.  Testing should be considered at a younger age or be carried out more frequently if you are overweight and have at least 1 risk factor for diabetes.  . Colorectal cancer can be detected and often prevented. Most routine colorectal cancer screening begins at the age of 14 and continues through age 19. However, your health care provider may recommend screening at an earlier age if you have risk factors for colon cancer. On a yearly basis, your health care provider may provide home test kits to check for hidden blood in the stool. Use of a small camera at the end of a tube to directly examine the colon (sigmoidoscopy or colonoscopy) can detect the earliest forms of colorectal cancer. Talk to your health care provider about this at age 66, when routine screening begins. Direct exam of the colon should be repeated every 5-10 years through age 18, unless early forms of precancerous polyps or small growths are found.  .  Lung cancer screening is recommended for adults aged 70-80 years who are at high risk for developing lung cancer because of a history of smoking. A yearly low-dose CT scan of the lungs is recommended for people who have at least a 30-pack-year history of smoking and are a current smoker or have quit within the past 15 years. A pack year of smoking is smoking an average of 1 pack of cigarettes a day for 1 year (for example: 1 pack a day for 30 years or 2 packs a day for 15 years). Yearly screening should continue until the smoker has stopped smoking for at least 15 years. Yearly screening should be stopped for people who develop a health problem that would  prevent them from having lung cancer treatment.  . Talk with your health care provider about prostate cancer screening.  . Testicular cancer screening is recommended for adult males. Screening includes self-exam and a health care provider exam. Consult with your health care provider about any symptoms you have or any concerns you have about testicular cancer.  . Use sunscreen. Apply sunscreen liberally and repeatedly throughout the day. You should seek shade when your shadow is shorter than you. Protect yourself by wearing long sleeves, pants, a wide-brimmed hat, and sunglasses year round, whenever you are outdoors.  . Once a month, do a whole-body skin exam, using a mirror to look at the skin on your back. Tell your health care provider about new moles, moles that have irregular borders, moles that are larger than a pencil eraser, or moles that have changed in shape or color.    ++++++++++++++++++++++++++++++++++++++++++++++++++++++++++++++++++  Stay current with required vaccines (immunizations).  ? Influenza vaccine. All adults should be immunized every year.  ? Tetanus, diphtheria, and acellular pertussis (Td, Tdap) vaccine. An adult who has not previously received Tdap or who does not know his  vaccine status should receive 1 dose of Tdap. This initial dose should be followed by tetanus and diphtheria toxoids (Td) booster doses every 10 years. Adults with an unknown or incomplete history of completing a 3-dose immunization series with Td-containing vaccines should begin or complete a primary immunization series including a Tdap dose. Adults should receive a Td booster every 10 years.  ? Varicella vaccine. An adult without evidence of immunity to varicella should receive 2 doses or a second dose if he has previously received 1 dose.  ? Human papillomavirus (HPV) vaccine. Males aged 61-21 years who have not received the vaccine previously should receive the 3-dose series. Males aged  22-26 years may be immunized. Immunization is recommended through the age of 78 years for any male who has sex with males and did not get any or all doses earlier. Immunization is recommended for any person with an immunocompromised condition through the age of 49 years if he did not get any or all doses earlier. During the 3-dose series, the second dose should be obtained 4-8 weeks after the first dose. The third dose should be obtained 24 weeks after the first dose and 16 weeks after the second dose.  ? Zoster vaccine. One dose is recommended for adults aged 50 years or older unless certain conditions are present.   ? PREVNAR - Pneumococcal 13-valent conjugate (PCV13) vaccine. When indicated, a person who is uncertain of his immunization history and has no record of immunization should receive the PCV13 vaccine. An adult aged 36 years or older who has certain medical conditions and has not been previously immunized should receive 1 dose of PCV13 vaccine. This PCV13 should be followed with a dose of pneumococcal polysaccharide (PPSV23) vaccine. The PPSV23 vaccine dose should be obtained at least 8 weeks after the dose of PCV13 vaccine. An adult aged 66 years or older who has certain medical conditions and previously received 1 or more doses of PPSV23 vaccine should receive 1 dose of PCV13. The PCV13 vaccine dose should be obtained 1 or more years after the last PPSV23 vaccine dose.   ? PNEUMOVAX - Pneumococcal polysaccharide (PPSV23) vaccine. When PCV13 is also indicated, PCV13 should be obtained first. All adults aged 29 years and older should be immunized. An adult younger than age 59 years who has certain medical conditions should be immunized. Any person who resides in a nursing home or long-term care facility should be immunized. An adult smoker should be immunized. People with an immunocompromised condition and certain other conditions should receive both PCV13 and PPSV23 vaccines. People with  human immunodeficiency virus (HIV) infection should be immunized as soon as possible after diagnosis. Immunization during chemotherapy or radiation therapy should be avoided. Routine use of PPSV23 vaccine is not recommended for American Indians, [place] Natives, or people younger than 65 years unless there are medical conditions that require PPSV23 vaccine. When indicated, people who have unknown immunization and have no record of immunization should receive PPSV23 vaccine. One-time revaccination 5 years after the first dose of PPSV23 is recommended for people aged 19-64 years who have chronic kidney failure, nephrotic syndrome, asplenia, or immunocompromised conditions. People who received 1-2 doses of PPSV23 before age 31 years should receive another dose of PPSV23 vaccine at age 63 years or later if at least 5 years have passed since the previous dose. Doses of PPSV23 are not needed for people immunized with PPSV23 at or after age 38 years.   ? Hepatitis A vaccine. Adults who wish to  be protected from this disease, have certain high-risk conditions, work with hepatitis A-infected animals, work in hepatitis A research labs, or travel to or work in countries with a high rate of hepatitis A should be immunized. Adults who were previously unvaccinated and who anticipate close contact with an international adoptee during the first 60 days after arrival in the [place] from a country with a high rate of hepatitis A should be immunized.  ? Hepatitis B vaccine. Adults should be immunized if they wish to be protected from this disease, have certain high-risk conditions, may be exposed to blood or other infectious body fluids, are household contacts or sex partners of hepatitis B positive people, are clients or workers in certain care facilities, or travel to or work in countries with a high rate of hepatitis B.     Preventive Service / Frequency  . Ages 13 to 67  Blood pressure check.  Lipid and  cholesterol check  Lung cancer screening. / Every year if you are aged 67-80 years and have a 30-pack-year history of smoking and currently smoke or have quit within the past 15 years. Yearly screening is stopped once you have quit smoking for at least 15 years or develop a health problem that would prevent you from having lung cancer treatment.  Fecal occult blood test (FOBT) of stool. / Every year beginning at age 37 and continuing until age 75. You may not have to do this test if you get a colonoscopy every 10 years.  Flexible sigmoidoscopy** or colonoscopy.** / Every 5 years for a flexible sigmoidoscopy or every 10 years for a colonoscopy beginning at age 72 and continuing until age 68. Screening for abdominal aortic aneurysm (AAA) by ultrasound is recommended for people who have history of high blood pressure or who are current or former smokers.  ++++++++++++++++++++++++++++++++++++++++++++++++++++++++++++++  Recommend Adult Low Dose Aspirin or coated Aspirin 81 mg daily To reduce risk of Colon Cancer 20 % Skin Cancer 26 %  Melanoma 46% and Pancreatic cancer 60%  +++++++++++++++++++++++++++++++++++++++++++++++++++++++++++++  Vitamin D goal is between 50-100. Please make sure that you are taking your Vitamin D as directed.  It is very important as a natural anti-inflammatory - helping with muscle and joint aches; as well as helping hair, skin, and nails; as well as reducing stroke, heart attack and cancer risk. It helps your bones and helps with mood. It also decreases numerous cancer risks so please take it as directed.  - Low Vit D is associated with a 200-300% higher risk for CANCER and 200-300% higher risk for HEART ATTACK & STROKE.  It is also associated with higher death rate at younger ages, autoimmune diseases like Rheumatoid arthritis, Lupus, Multiple Sclerosis; also many other serious conditions, like depression, Alzheimer's Dementia, infertility, muscle aches, fatigue,  fibromyalgia - just to name a few.  +++++++++++++++++++++++++++++++++++++++++++++++++++++++++++  Recommend the book "The END of DIETING" by Dr Excell Seltzer & the book "The END of DIABETES " by Dr Excell Seltzer At [email or web address] - get book & Audio CD's   --->Being diabetic has a 300% increased risk for heart attack, stroke, cancer, and alzheimer- type vascular dementia. It is very important that you work harder with diet by avoiding all foods that are white. Avoid white rice (brown & wild rice is OK), white potatoes (sweet potatoes in moderation is OK), White bread or wheat bread or anything made out of white flour like bagels, donuts, rolls, buns, biscuits, cakes, pastries, cookies, pizza crust, and pasta (made from  white flour & egg whites) - vegetarian pasta or spinach or wheat pasta is OK. Multigrain breads like Arnold's or Pepperidge [employer], or multigrain sandwich thins or flatbreads. Diet, exercise and weight loss can reverse and cure diabetes in the early stages. Diet, exercise and weight loss is very important in the control and prevention of complications of diabetes which affects every system in your body, ie. Brain - dementia/stroke, eyes - glaucoma/blindness, heart - heart attack/heart failure, kidneys - dialysis, stomach - gastric paralysis, intestines - malabsorption, nerves - severe painful neuritis, circulation - gangrene & loss of a leg(s), and finally cancer and Alzheimers.  I recommend avoid fried & greasy foods, sweets/candy, white rice (brown or wild rice or Quinoa is OK), white potatoes (sweet potatoes are OK) - anything made from white flour - bagels, doughnuts, rolls, buns, biscuits,white and wheat breads, pizza crust and traditional pasta made of white flour & egg white(vegetarian pasta or spinach or wheat pasta is OK). Multi-grain bread is OK - like multi-grain flat bread or sandwich thins. Avoid alcohol in excess.  Exercise is also important. Eat all the vegetables you want - avoid  fatty meats, especially red meat and dairy - especially cheese. Cheese is the most concentrated form of trans-fats which is the worst thing to clog up our arteries. Veggie cheese is OK which can be found in the fresh produce section at [employer] or [employer] or [employer].  ++++++++++++++++++++++ DASH Eating Plan  DASH stands for "Dietary Approaches to Stop Hypertension."  The DASH eating plan is a healthy eating plan that has been shown to reduce high blood pressure (hypertension). Additional health benefits may include reducing the risk of type 2 diabetes mellitus, heart disease, and stroke. The DASH eating plan may also help with weight loss.  WHAT DO I NEED TO KNOW ABOUT THE DASH EATING PLAN? For the DASH eating plan, you will follow these general guidelines: . Choose foods with a percent daily value for sodium of less than 5% (as listed on the food label). . Use salt-free seasonings or herbs instead of table salt or sea salt. . Check with your health care provider or pharmacist before using salt substitutes. . Eat lower-sodium products, often labeled as "lower sodium" or "no salt added." . Eat fresh foods. . Eat more vegetables, fruits, and low-fat dairy products. . Choose whole grains. Look for the word "whole" as the first word in the ingredient list. . Choose fish . Limit sweets, desserts, sugars, and sugary drinks. . Choose heart-healthy fats. . Eat veggie cheese . Eat more home-cooked food and less restaurant, buffet, and fast food. . Limit fried foods. Lacinda Axon foods using methods other than frying. . Limit canned vegetables. If you do use them, rinse them well to decrease the sodium. . When eating at a restaurant, ask that your food be prepared with less salt, or no salt if possible.   WHAT FOODS CAN I EAT? Read Dr Fara Olden Fuhrman's books on The End of Dieting & The End of Diabetes  Grains Whole grain or whole wheat bread. Brown rice. Whole grain or whole  wheat pasta. Quinoa, bulgur, and whole grain cereals. Low-sodium cereals. Corn or whole wheat flour tortillas. Whole grain cornbread. Whole grain crackers. Low-sodium crackers.  Vegetables Fresh or frozen vegetables (raw, steamed, roasted, or grilled). Low-sodium or reduced-sodium tomato and vegetable juices. Low-sodium or reduced-sodium tomato sauce and paste. Low-sodium or reduced-sodium canned vegetables.  Fruits All fresh, canned (in natural juice), or frozen fruits.  Protein  Products All fish and seafood. Dried beans, peas, or lentils. Unsalted nuts and seeds. Unsalted canned beans.  Dairy Low-fat dairy products, such as skim or 1% milk, 2% or reduced-fat cheeses, low-fat ricotta or cottage cheese, or plain low-fat yogurt. Low-sodium or reduced-sodium cheeses.  Fats and Oils Tub margarines without trans fats. Light or reduced-fat mayonnaise and salad dressings (reduced sodium). Avocado. Safflower, olive, or canola oils. Natural peanut or almond butter.  Other Unsalted popcorn and pretzels. The items listed above may not be a complete list of recommended foods or beverages. Contact your dietitian for more options.  ++++++++++++++++++++++++++++++++++++++++++++++++++++++++++++++++  WHAT FOODS ARE NOT RECOMMENDED?  Grains/ White flour or wheat flour White bread. White pasta. White rice. Refined cornbread. Bagels and croissants. Crackers that contain trans fat. Vegetables Creamed or fried vegetables. Vegetables in a . Regular canned vegetables. Regular canned tomato sauce and paste. Regular tomato and vegetable juices. Fruits Dried fruits. Canned fruit in light or heavy syrup. Fruit juice. Meat and Other Protein Products Meat in general - RED meat & White meat. Fatty cuts of meat. Ribs, chicken wings, all processed meats as bacon, sausage, bologna, salami, fatback, hot dogs, bratwurst and packaged luncheon meats. Dairy Whole or 2% milk, cream, half-and-half, and cream cheese.  Whole-fat or sweetened yogurt. Full-fat cheeses or blue cheese. Non-dairy creamers and whipped toppings. Processed cheese, cheese spreads, or cheese curds.  Condiments Onion and garlic salt, seasoned salt, table salt, and sea salt. Canned and packaged gravies. Worcestershire sauce. Tartar sauce. Barbecue sauce. Teriyaki sauce. Soy sauce, including reduced sodium. Steak sauce. Fish sauce. Oyster sauce. Cocktail sauce. Horseradish. Ketchup and mustard. Meat flavorings and tenderizers. Bouillon cubes. Hot sauce. Tabasco sauce. Marinades. Taco seasonings. Relishes. Fats and Oils Butter, stick margarine, lard, shortening and bacon fat. Coconut, palm kernel, or palm oils. Regular salad dressings. Pickles and olives. Salted popcorn and pretzels. The items listed above may not be a complete list of foods and beverages to avoid.

## 2017-04-28 LAB — COMPREHENSIVE METABOLIC PANEL
ALT: 30 IU/L (ref 0–44)
AST: 30 IU/L (ref 0–40)
Albumin/Globulin Ratio: 1.5 (ref 1.2–2.2)
Albumin: 4.1 g/dL (ref 3.5–5.5)
Alkaline Phosphatase: 54 IU/L (ref 39–117)
BUN/Creatinine Ratio: 13 (ref 9–20)
BUN: 13 mg/dL (ref 6–24)
Bilirubin Total: 0.4 mg/dL (ref 0.0–1.2)
CO2: 25 mmol/L (ref 20–29)
Calcium: 9.6 mg/dL (ref 8.7–10.2)
Chloride: 101 mmol/L (ref 96–106)
Creatinine, Ser: 1.01 mg/dL (ref 0.76–1.27)
GFR calc Af Amer: 103 mL/min/{1.73_m2} (ref >59)
GFR calc non Af Amer: 89 mL/min/{1.73_m2} (ref >59)
Globulin, Total: 2.7 g/dL (ref 1.5–4.5)
Glucose: 80 mg/dL (ref 65–99)
Potassium: 4.2 mmol/L (ref 3.5–5.2)
Sodium: 142 mmol/L (ref 134–144)
Total Protein: 6.8 g/dL (ref 6.0–8.5)

## 2017-04-28 LAB — URIC ACID: Uric Acid: 10.7 mg/dL — ABNORMAL HIGH (ref 3.7–8.6)

## 2017-04-28 LAB — CBC WITH DIFFERENTIAL
Basophils Absolute: 0 10*3/uL (ref 0.0–0.2)
Basos: 1 %
EOS (ABSOLUTE): 0.3 10*3/uL (ref 0.0–0.4)
Eos: 4 %
Hematocrit: 42.9 % (ref 37.5–51.0)
Hemoglobin: 14 g/dL (ref 13.0–17.7)
Immature Grans (Abs): 0 10*3/uL (ref 0.0–0.1)
Immature Granulocytes: 0 %
Lymphocytes Absolute: 3 10*3/uL (ref 0.7–3.1)
Lymphs: 36 %
MCH: 27.9 pg (ref 26.6–33.0)
MCHC: 32.6 g/dL (ref 31.5–35.7)
MCV: 86 fL (ref 79–97)
Monocytes Absolute: 0.8 10*3/uL (ref 0.1–0.9)
Monocytes: 9 %
Neutrophils Absolute: 4.3 10*3/uL (ref 1.4–7.0)
Neutrophils: 50 %
RBC: 5.01 x10E6/uL (ref 4.14–5.80)
RDW: 13.9 % (ref 12.3–15.4)
WBC: 8.3 10*3/uL (ref 3.4–10.8)

## 2017-04-28 LAB — LIPID PANEL
Chol/HDL Ratio: 3.4 ratio (ref 0.0–5.0)
Cholesterol, Total: 166 mg/dL (ref 100–199)
HDL: 49 mg/dL (ref >39)
LDL Calculated: 93 mg/dL (ref 0–99)
Triglycerides: 122 mg/dL (ref 0–149)
VLDL Cholesterol Cal: 24 mg/dL (ref 5–40)

## 2017-04-28 LAB — TESTOSTERONE, FREE, TOTAL, SHBG
Sex Hormone Binding: 86 nmol/L — ABNORMAL HIGH (ref 16.5–55.9)
Testosterone, Free: 16.7 pg/mL (ref 6.8–21.5)
Testosterone: 1008 ng/dL — ABNORMAL HIGH (ref 264–916)

## 2017-04-28 LAB — VITAMIN D 25 HYDROXY (VIT D DEFICIENCY, FRACTURES): Vit D, 25-Hydroxy: 28.3 ng/mL — ABNORMAL LOW (ref 30.0–100.0)

## 2017-04-28 LAB — TSH: TSH: 1.95 u[IU]/mL (ref 0.450–4.500)

## 2017-04-28 LAB — T4, FREE: Free T4: 0.95 ng/dL (ref 0.82–1.77)

## 2017-04-28 LAB — PROLACTIN: Prolactin: 22.9 ng/mL — ABNORMAL HIGH (ref 4.0–15.2)

## 2017-05-03 DIAGNOSIS — F4323 Adjustment disorder with mixed anxiety and depressed mood: Secondary | ICD-10-CM | POA: Diagnosis not present

## 2017-05-16 ENCOUNTER — Ambulatory Visit (INDEPENDENT_AMBULATORY_CARE_PROVIDER_SITE_OTHER): Payer: BLUE CROSS/BLUE SHIELD

## 2017-05-16 ENCOUNTER — Telehealth: Payer: Self-pay

## 2017-05-16 VITALS — BP 99/66 | HR 63 | Temp 98.0°F

## 2017-05-16 DIAGNOSIS — Z23 Encounter for immunization: Secondary | ICD-10-CM

## 2017-05-16 NOTE — Telephone Encounter (Signed)
Received Epic notification that pt has not read MyChart message.  Called pt to discuss and he stated that he will call back to discuss.  Charyl Bigger, CMA  David Aguilar it was great seeing you in the office recently.    As you can see here are the results of your recent labs. I know I got many of them in anticipation of you seeing your endocrinologist, Dr. Chalmers Cater so please take these results to discuss with her specifically the uric acid, prolactin and testosterone. The rest the labs that I ordered for you look within normal limits. Cholesterol, thyroid function, kidney and liver and blood counts all look within normal limits. Your vitamin D is a little low and I recommend you take 5000 IUs of vitamin D3 daily. We can recheck this in 3-6 months or the next time we draw blood.   I will have my medical assistants forward this to Dr. Chalmers Cater for her review as well, but please make sure you discuss the aforementioned labs with her.   Thanks and take care.   Be well,   Dr. Raliegh Scarlet

## 2017-05-16 NOTE — Telephone Encounter (Signed)
Pt informed of results.  Pt expressed understanding and is agreeable.  T. Sabryna Lahm, CMA 

## 2017-05-28 DIAGNOSIS — E291 Testicular hypofunction: Secondary | ICD-10-CM | POA: Diagnosis not present

## 2017-05-28 DIAGNOSIS — E221 Hyperprolactinemia: Secondary | ICD-10-CM | POA: Diagnosis not present

## 2017-05-28 DIAGNOSIS — D443 Neoplasm of uncertain behavior of pituitary gland: Secondary | ICD-10-CM | POA: Diagnosis not present

## 2017-06-01 DIAGNOSIS — E291 Testicular hypofunction: Secondary | ICD-10-CM | POA: Diagnosis not present

## 2017-06-05 ENCOUNTER — Telehealth: Payer: Self-pay | Admitting: Family Medicine

## 2017-06-05 DIAGNOSIS — E291 Testicular hypofunction: Secondary | ICD-10-CM | POA: Diagnosis not present

## 2017-06-05 DIAGNOSIS — D352 Benign neoplasm of pituitary gland: Secondary | ICD-10-CM | POA: Diagnosis not present

## 2017-06-05 NOTE — Telephone Encounter (Signed)
Pt called states [employer] did not pay for labs drawn on last visit because of  Incorrect coding-- Pt states [employer]/ [employer] paid the same charges last year-- He has contact [employer] and been advised that we filed with incorrect code--frwding acct to [place] for review.  --glh

## 2017-06-11 DIAGNOSIS — E221 Hyperprolactinemia: Secondary | ICD-10-CM | POA: Diagnosis not present

## 2017-06-11 DIAGNOSIS — E291 Testicular hypofunction: Secondary | ICD-10-CM | POA: Diagnosis not present

## 2017-06-11 DIAGNOSIS — I10 Essential (primary) hypertension: Secondary | ICD-10-CM | POA: Diagnosis not present

## 2017-06-11 DIAGNOSIS — D443 Neoplasm of uncertain behavior of pituitary gland: Secondary | ICD-10-CM | POA: Diagnosis not present

## 2017-06-11 DIAGNOSIS — R7301 Impaired fasting glucose: Secondary | ICD-10-CM | POA: Diagnosis not present

## 2017-07-03 DIAGNOSIS — F4323 Adjustment disorder with mixed anxiety and depressed mood: Secondary | ICD-10-CM | POA: Diagnosis not present

## 2017-07-16 DIAGNOSIS — F4323 Adjustment disorder with mixed anxiety and depressed mood: Secondary | ICD-10-CM | POA: Diagnosis not present

## 2017-07-17 DIAGNOSIS — E291 Testicular hypofunction: Secondary | ICD-10-CM | POA: Diagnosis not present

## 2017-08-14 DIAGNOSIS — F4323 Adjustment disorder with mixed anxiety and depressed mood: Secondary | ICD-10-CM | POA: Diagnosis not present

## 2017-08-28 DIAGNOSIS — F4323 Adjustment disorder with mixed anxiety and depressed mood: Secondary | ICD-10-CM | POA: Diagnosis not present

## 2017-08-29 DIAGNOSIS — F4323 Adjustment disorder with mixed anxiety and depressed mood: Secondary | ICD-10-CM | POA: Diagnosis not present

## 2017-08-30 DIAGNOSIS — F4323 Adjustment disorder with mixed anxiety and depressed mood: Secondary | ICD-10-CM | POA: Diagnosis not present

## 2017-08-31 DIAGNOSIS — F4323 Adjustment disorder with mixed anxiety and depressed mood: Secondary | ICD-10-CM | POA: Diagnosis not present

## 2017-09-03 DIAGNOSIS — F4323 Adjustment disorder with mixed anxiety and depressed mood: Secondary | ICD-10-CM | POA: Diagnosis not present

## 2017-09-05 DIAGNOSIS — F4323 Adjustment disorder with mixed anxiety and depressed mood: Secondary | ICD-10-CM | POA: Diagnosis not present

## 2017-09-11 DIAGNOSIS — F4323 Adjustment disorder with mixed anxiety and depressed mood: Secondary | ICD-10-CM | POA: Diagnosis not present

## 2017-09-27 ENCOUNTER — Encounter: Payer: Self-pay | Admitting: Family Medicine

## 2017-09-27 ENCOUNTER — Ambulatory Visit: Payer: BLUE CROSS/BLUE SHIELD | Admitting: Family Medicine

## 2017-09-27 VITALS — BP 117/80 | HR 77 | Temp 98.2°F | Ht 72.0 in | Wt 286.8 lb

## 2017-09-27 DIAGNOSIS — J4540 Moderate persistent asthma, uncomplicated: Secondary | ICD-10-CM | POA: Diagnosis not present

## 2017-09-27 DIAGNOSIS — R062 Wheezing: Secondary | ICD-10-CM

## 2017-09-27 DIAGNOSIS — J40 Bronchitis, not specified as acute or chronic: Secondary | ICD-10-CM

## 2017-09-27 DIAGNOSIS — Z630 Problems in relationship with spouse or partner: Secondary | ICD-10-CM

## 2017-09-27 DIAGNOSIS — R05 Cough: Secondary | ICD-10-CM | POA: Diagnosis not present

## 2017-09-27 DIAGNOSIS — R457 State of emotional shock and stress, unspecified: Secondary | ICD-10-CM | POA: Diagnosis not present

## 2017-09-27 DIAGNOSIS — J310 Chronic rhinitis: Secondary | ICD-10-CM | POA: Insufficient documentation

## 2017-09-27 DIAGNOSIS — J329 Chronic sinusitis, unspecified: Secondary | ICD-10-CM | POA: Insufficient documentation

## 2017-09-27 DIAGNOSIS — R059 Cough, unspecified: Secondary | ICD-10-CM

## 2017-09-27 MED ORDER — HYDROCOD POLST-CPM POLST ER 10-8 MG/5ML PO SUER: 5.0000 mL | Freq: Two times a day (BID) | ORAL | 0 refills | Status: DC | PRN

## 2017-09-27 MED ORDER — PREDNISONE 20 MG PO TABS
ORAL_TABLET | ORAL | 0 refills | Status: DC
Start: 2017-09-27 — End: 2017-10-12

## 2017-09-27 MED ORDER — AZITHROMYCIN 250 MG PO TABS: 250.0000 mg | ORAL_TABLET | Freq: Every day | ORAL | 0 refills | Status: DC

## 2017-09-27 MED ORDER — ALBUTEROL SULFATE HFA 108 (90 BASE) MCG/ACT IN AERS: 2.0000 | INHALATION_SPRAY | RESPIRATORY_TRACT | 2 refills | Status: DC | PRN

## 2017-09-27 NOTE — Progress Notes (Signed)
Acute Care Office visit  Assessment and plan:  1. Bronchitis   2. Cough   3. Rhinosinusitis   4. Wheezing   5. Moderate persistent asthma, unspecified whether complicated   6. Under emotional stress due to marital discourse    7. Marital dysfunction    1. Bronchitis -start prednisone taper 2. Cough -start tussionex 3. rhinosinusitis -start zpack 4. Wheezing -refill albuterol 5. Moderate persistent asthma 6. Patient will continue to go to counseling individually as well as with his wife for couples counseling.  Helping some and has improved his mood  - Viral vs Allergic vs Bacterial causes for pt's symptoms reveiwed.    - Supportive care and various OTC medications discussed in addition to any prescribed. - Call or RTC if new symptoms, or if no improvement or worse over next several days.   - Will consider ABX if sx continue past 10 days and worsening if not already given.    Meds ordered this encounter  Medications  . predniSONE (DELTASONE) 20 MG tablet    Sig: Take 3 pills a day for 2 days, 2 pills a day for 2 days, 1 pill a day for 2 days then one half pill a day for 2 days then off    Dispense:  14 tablet    Refill:  0  . chlorpheniramine-HYDROcodone (TUSSIONEX) 10-8 MG/5ML SUER    Sig: Take 5 mLs by mouth every 12 (twelve) hours as needed for cough (cough, will cause drowsiness.).    Dispense:  200 mL    Refill:  0  . azithromycin (ZITHROMAX) 250 MG tablet    Sig: Take 1 tablet (250 mg total) by mouth daily. Take first 2 tablets together, then 1 every day until finished.    Dispense:  6 tablet    Refill:  0  . albuterol (PROVENTIL HFA;VENTOLIN HFA) 108 (90 Base) MCG/ACT inhaler    Sig: Inhale 2 puffs into the lungs every 4 (four) hours as needed for wheezing (cough, shortness of breath or wheezing.).    Dispense:  1 Inhaler    Refill:  2    Gross side effects, risk and benefits, and alternatives of medications discussed with patient.  Patient is aware that  all medications have potential side effects and we are unable to predict every sideeffect or drug-drug interaction that may occur.  Expresses verbal understanding and consents to current therapy plan and treatment regiment.   Education and routine counseling performed. Handouts provided.  Anticipatory guidance and routine counseling done re: condition, txmnt options and need for follow up. All questions of patient's were answered.  Return if symptoms worsen or fail to improve, for f/up routine chronic care.  Please see AVS handed out to patient at the end of our visit for additional patient instructions/ counseling done pertaining to today's office visit.  Note: This document was partially repared using Dragon voice recognition software and may include unintentional dictation errors.  This document serves as a record of services personally performed by Mellody Dance, DO. It was created on her behalf by Mayer Masker, a trained medical scribe. The creation of this record is based on the scribe's personal observations and the provider's statements to them.   I have reviewed the above medical documentation for accuracy and completeness and I concur.  Mellody Dance 09/27/17 12:08 PM   Subjective:    Chief Complaint  Patient presents with  . Cough    non productive cough, congestion x 5 days  HPI:  Pt presents with Sx for 5 days.   C/o: non-productive cough, chest congestion, tiredness, wheezing. He states it started up in his head, but now it is in his chest.   He has a h/o bronchitis and has taken zpack and prednisone with good relief in the past.   Denies: fever, chills    For symptoms patient has tried:  Inhaler, with mild, short-term relief. He has also taken dayquil at night with relief.   Overall getting:   worse   Familial stress Pt reports his wife went to counseling individually and her mood has been improved. He also went to counseling as well. They have an  appointment next week for another counseling appointment. He states their relationship is improving as a result. He reports she has been thanking him more frequently and they have been communicating well since going to counseling.    Patient Care Team    Relationship Specialty Notifications Start End  Mellody Dance, DO PCP - General Family Medicine  01/03/16   Jacelyn Pi, MD Consulting Physician Endocrinology  08/20/15   Carolan Clines, MD Consulting Physician Urology  05/16/16     Past medical history, Surgical history, Family history reviewed and noted below, Social history, Allergies, and Medications have been entered into the medical record, reviewed and changed as needed.   Allergies  Allergen Reactions  . Bee Venom     Review of Systems: - see above HPI for pertinent positives General:   No F/C, wt loss Pulm:   No DIB, pleuritic chest pain Card:  No CP, palpitations Abd:  No n/v/d or pain Ext:  No inc edema from baseline   Objective:   Blood pressure 117/80, pulse 77, temperature 98.2 F (36.8 C), height 6' (1.829 m), weight 286 lb 12.8 oz (130.1 kg), SpO2 97 %. Body mass index is 38.9 kg/m. General: Well Developed, well nourished, appropriate for stated age.  Neuro: Alert and oriented x3, extra-ocular muscles intact, sensation grossly intact.  HEENT: Normocephalic, atraumatic, pupils equal round reactive to light, neck supple, no masses, no painful lymphadenopathy, TM's intact B/L, no acute findings. Nares- patent, clear d/c, OP- clear, mild erythema, No TTP sinuses Skin: Warm and dry, no gross rash. Cardiac: RRR, S1 S2,  no murmurs rubs or gallops.  Respiratory: ECTA B/L and A/P, Not using accessory muscles, speaking in full sentences- unlabored.  Vascular:  No gross lower ext edema, cap RF less 2 sec. Psych: No HI/SI, judgement and insight good, Euthymic mood. Full Affect.

## 2017-10-07 ENCOUNTER — Encounter: Payer: Self-pay | Admitting: Family Medicine

## 2017-10-09 ENCOUNTER — Other Ambulatory Visit: Payer: Self-pay

## 2017-10-09 ENCOUNTER — Telehealth: Payer: Self-pay | Admitting: Family Medicine

## 2017-10-09 DIAGNOSIS — F4323 Adjustment disorder with mixed anxiety and depressed mood: Secondary | ICD-10-CM | POA: Diagnosis not present

## 2017-10-09 MED ORDER — COLCHICINE 0.6 MG PO TABS: ORAL_TABLET | ORAL | 0 refills | Status: DC

## 2017-10-09 NOTE — Telephone Encounter (Signed)
Patient sent MyChart message over the weekend about gout and called this morning still waiting for response, please advise

## 2017-10-09 NOTE — Progress Notes (Signed)
Per note from Dr. Raliegh Scarlet sent in medication - Colcrys sent into the pharmacy and message was sent to patient to notify. Also called patient and left message for patient to call the office. MPulliam, CMA/RT(R)   Can you please call and ask him how many flareup\acute bouts of terrible pain he had in his joints in the past year. If he has not had many, he does not need a chronic suppressive treatment but rather we can just go with Colcrys.     -Colcrys prescription: 0.6mg  tabs would be taken:  1.2 mg p.o. x1 then 0.6 mg p.o. 1 hour later x1 at first onset of gouty flare. Dispense 20, no refill    -Please let me know if you would like chronic suppressive therapy in addition and how many flares he had per year. Thank you      ----- Message -----  From: Jerilee Field, CMA  Sent: 10/08/2017 12:33 PM  To: Mellody Dance, DO  Subject: FW: Visit Follow-Up Question             Patient was last seen 09/27/17. Please advise. Thanks. MPulliam, CMA/RT(R)    ----- Message -----  From: Carrie Mew  Sent: 10/07/2017  8:41 PM  To: [facility]  Subject: Visit Follow-Up Question               ----- Message from [place], Generic sent at 10/07/2017 8:41 PM EDT -----    Hey Dr. Melody Haver you had a nice weekend. I had a pretty bad episode of gout this weekend. My uric acid levels have been fairly consistently high since Sept 2018 from appts at [facility] as well as my appts with Endocronologist, Dr. Chalmers Cater. I wanted to ask if you I could get a prescription of either Colcrys or Allopurinol? I hate to have to go on a preventive medication, but certainly understand if I do. Not sure how allopurinol would "react" with the other medications that I am taking regularly - Cabergoline, Montelucast, atenolol, metformin.  Thought it wouldnt hurt to ask. I am traveling to [place] Monday so I will be on the road for at least 4 hours during the day. A message can be left  on my cell - [phone]. Thanks again for the consideration.    [name] :)

## 2017-10-09 NOTE — Telephone Encounter (Signed)
Medication sent in, message sent to patient. MPulliam, CMA/RT(R)

## 2017-10-10 ENCOUNTER — Telehealth: Payer: Self-pay | Admitting: Family Medicine

## 2017-10-10 NOTE — Telephone Encounter (Signed)
Called patient left message to call back MPulliam, CMA/RT(R)  

## 2017-10-10 NOTE — Telephone Encounter (Signed)
Please tell [name] it would be best to see what his uric acid levels are at baseline when he is not in an acute flare.   We may need to start treatment for prevention since he is having flare-ups that frequently.

## 2017-10-10 NOTE — Telephone Encounter (Signed)
Patient states he was suppose to call office to give information regarding his Gout episodes-- Pt says since Sept last year he has had (5) episodes, 3 have been severely painful, while 2 have been mild--- Patient states had one as recent as Sat- 10/06/17 it was kind of severe.  --Please call pt if questions.

## 2017-10-10 NOTE — Telephone Encounter (Signed)
Patient called with this information, please advise.    Following is the note form yesterday. "Can you please call and ask him how many flareup\acute bouts of terrible pain he had in his joints in the past year. If he has not had many, he does not need a chronic suppressive treatment but rather we can just go with Colcrys.    -Colcrys prescription: 0.6mg  tabs would be taken:  1.2 mg p.o. x1 then 0.6 mg p.o. 1 hour later x1 at first onset of gouty flare. Dispense 20, no refill   -Please let me know if you would like chronic suppressive therapy in addition and how many flares he had per year. Thank you "  Acute medication has been sent into the pharmacy. Please advise if other medication is also need. MPulliam, CMA/RT(R)

## 2017-10-12 ENCOUNTER — Ambulatory Visit: Payer: BLUE CROSS/BLUE SHIELD | Admitting: Family Medicine

## 2017-10-12 VITALS — BP 123/76 | HR 77 | Ht 72.0 in | Wt 279.0 lb

## 2017-10-12 DIAGNOSIS — E559 Vitamin D deficiency, unspecified: Secondary | ICD-10-CM | POA: Diagnosis not present

## 2017-10-12 DIAGNOSIS — I10 Essential (primary) hypertension: Secondary | ICD-10-CM

## 2017-10-12 DIAGNOSIS — E291 Testicular hypofunction: Secondary | ICD-10-CM

## 2017-10-12 DIAGNOSIS — Z7189 Other specified counseling: Secondary | ICD-10-CM

## 2017-10-12 DIAGNOSIS — R7303 Prediabetes: Secondary | ICD-10-CM | POA: Diagnosis not present

## 2017-10-12 DIAGNOSIS — M109 Gout, unspecified: Secondary | ICD-10-CM | POA: Diagnosis not present

## 2017-10-12 MED ORDER — VITAMIN D (ERGOCALCIFEROL) 1.25 MG (50000 UNIT) PO CAPS: 50000.0000 [IU] | ORAL_CAPSULE | ORAL | 10 refills | Status: DC

## 2017-10-12 MED ORDER — IBUPROFEN 600 MG PO TABS: 600.0000 mg | ORAL_TABLET | Freq: Three times a day (TID) | ORAL | 0 refills | Status: DC | PRN

## 2017-10-12 MED ORDER — LOSARTAN POTASSIUM 100 MG PO TABS: 50.0000 mg | ORAL_TABLET | Freq: Every day | ORAL | 3 refills | Status: DC

## 2017-10-12 NOTE — Progress Notes (Signed)
Impression and Recommendations:    1. Acute gouty arthropathy- b/l ankles   2. Essential hypertension   3. Obesity, Class III, BMI 40-49.9 (morbid obesity) ([place])   4. Prediabetes   5. Counseling on health promotion and disease prevention   6. Vitamin D insufficiency   7. Hypogonadism male     1. Gout Flares  - Will check his CBC and uric acid today.  - Reviewed that increasing weight can cause gout, high purine intake can cause gout, and medications can cause gout.  - First we will try to change his diet, avoid high-purine foods, and change the patient's blood pressure medicine.  - Advised that the patient should be eating less purines.  Follow recommendations on gout diet. - Reviewed what is allowed for him to eat and what is not.  Handout provided on low-purine diet. - Will hold off on chronic suppressive therapy as the patient strives to adjust his diet.  - Recommended continued weight loss, continued exercise, and increased water intake.  - Will continue to monitor closely.  Pain from Gouty Arthropathy - Patient requested prescription of 600 ibuprofen.  Reviewed potential risk for S/E including gastric ulcer on high dose of ibuprofen. - Told patient to also take an H2 blocker such as ranitidine (zanac) with it, if taking for any prolonged period.   2. Blood Pressure Medicine Adjustment (Gout) - Patient has been taking HCTZ for the past three years.  - Advised the patient that taking HCTZ can make him more prone to gout.  If he wants to be taken off of HCTZ, we can try taking him off of that.  - Losartan discussed.  Reviewed that it not only has a reduced risk for gout, but also helps with ED and can improve erectile dysfunction.  - Noted that losartan works through the kidneys.  Advised the patient that if he is inactive and not moving around regularly, he might notice swelling in his feet.  Changing from HCTZ, he may also possibly notice that his heart rate is up a  bit.  - By switching to losartan, we are looking to have continued good blood pressures, good heart rate, and less gout.  - Patient will begin taking a half a tablet of losartan per day. - If the need arises to add a second blood pressure medication, we will start with a Calcium channel blocker.  We will incorporate this in the future as needed to help prevent the onset of gout, as well as improve his blood pressure.   3. Health Maintenance - Please obtain prescription vitamin D and start taking that once a week.  - Discussed ED and hypogonadism with the patient today.  - Advised patient to continue working toward exercising to improve health.    - PT will begin with 15 minutes of activity daily. Recommended that the patient eventually strive for at least 150 minutes of cardio per week according to the [facility].   - Healthy dietary habits encouraged, including low-carb, low-purine, and high amounts of lean protein in diet.   - Patient should also consume adequate amounts of water - half of body weight in oz of water per day   4. Follow-Up - 3-4 months to re-check vitamin D, uric acid.  - We will see how the gout is doing.  - Re-check blood pressure since medication change  - Patient knows to return in 4 weeks if his blood pressure is strange.  Come in sooner if medication change  causes concerns.   Orders Placed This Encounter  Procedures  . Uric acid  . CBC with Differential/Platelet    Meds ordered this encounter  Medications  . losartan (COZAAR) 100 MG tablet    Sig: Take 0.5 tablets (50 mg total) by mouth daily.    Dispense:  90 tablet    Refill:  3  . Vitamin D, Ergocalciferol, (DRISDOL) 50000 units CAPS capsule    Sig: Take 1 capsule (50,000 Units total) by mouth every 7 (seven) days.    Dispense:  12 capsule    Refill:  10  . ibuprofen (ADVIL,MOTRIN) 600 MG tablet    Sig: Take 1 tablet (600 mg total) by mouth every 8 (eight) hours as needed.    Dispense:  30 tablet      Refill:  0    Gross side effects, risk and benefits, and alternatives of medications and treatment plan in general discussed with patient.  Patient is aware that all medications have potential side effects and we are unable to predict every side effect or drug-drug interaction that may occur.   Patient will call with any questions prior to using medication if they have concerns.  Expresses verbal understanding and consents to current therapy and treatment regimen.  No barriers to understanding were identified.  Red flag symptoms and signs discussed in detail.  Patient expressed understanding regarding what to do in case of emergency\urgent symptoms  Please see AVS handed out to patient at the end of our visit for further patient instructions/ counseling done pertaining to today's office visit.   Return in about 4 months (around 02/11/2018) for 3-4 mo or so to reck Vit D, Uric Acid, rechk BP since change in meds.    Note: This note was prepared with assistance of Dragon voice recognition software. Occasional wrong-word or sound-a-like substitutions may have occurred due to the inherent limitations of voice recognition software.    This document serves as a record of services personally performed by Mellody Dance, DO. It was created on her behalf by Toni Amend, a trained medical scribe. The creation of this record is based on the scribe's personal observations and the provider's statements to them.   I have reviewed the above medical documentation for accuracy and completeness and I concur.  Mellody Dance 10/12/17 12:43 PM   ----------------------------------------------------------------------------------------------------------------------------------------------------------------------------------------------------------   Subjective:     HPI: David Aguilar is a 47 y.o. male who presents to [place] at [facility] today for issues as discussed  below.   Gout Flare-Ups Is having gout flare-ups.  Gout flare on L foot is hot today. Gout flare-ups began happening September of last year (5 months ago). This is his fifth flare since that time, most in his ankles, one in his toe.  Previously the flares had been in his right foot. This most recent flare is in the left foot.  6 days ago, on Saturday afternoon, he wondered if he twisted his ankle.   The pain got worse and worse, and he realized it was a gout flare. At night, he could barely walk on it.  Denies feeling red or hot, but notes that it was swollen.  At that time, he took two prednisone and two Alleve. Woke up and it was still hurting the next morning, but felt better. Felt tender for the next couple of days, then he woke up yesterday and it was "roaring." Took four pills of the medication Colcrys yesterday - 2 at onset, 1  an hour later, and 1 an hour after that. Denied any GI side-effect other than loose stool/diarrhea today.  The patient has read about gout-prevention diet. He does eat steak, hamburger, shrimp, salmon.  Notes that he eats beef probably more than once a week.  Pain from Gouty Arthropathy Patient wants a prescription of 600 ibuprofen just in case of gout pain.  Blood Pressure Was previously taking HCTZ and went off of it.  When he lost some weight, he noticed that his heart rate was in the upper 70's, low 80's, and got back on the HCTZ.  Has been taking it for the past 3 years.   Has lost about 40 lbs since summer of 2017.  Patient does not find his resting heart rate high at this time.  General Health Has had one tension headache since January of 2016.  Used to get them every month.  Has not been taking Vitamin D supplement.   Wt Readings from Last 3 Encounters:  10/12/17 279 lb (126.6 kg)  09/27/17 286 lb 12.8 oz (130.1 kg)  04/27/17 285 lb 14.4 oz (129.7 kg)   BP Readings from Last 3 Encounters:  10/12/17 123/76  09/27/17 117/80   05/16/17 99/66   Pulse Readings from Last 3 Encounters:  10/12/17 77  09/27/17 77  05/16/17 63   BMI Readings from Last 3 Encounters:  10/12/17 37.84 kg/m  09/27/17 38.90 kg/m  04/27/17 38.78 kg/m     Patient Care Team    Relationship Specialty Notifications Start End  Mellody Dance, DO PCP - General Family Medicine  01/03/16   Jacelyn Pi, MD Consulting Physician Endocrinology  08/20/15   Carolan Clines, MD Consulting Physician Urology  05/16/16      Patient Active Problem List   Diagnosis Date Noted  . Prediabetes 05/16/2016    Priority: High  . Essential hypertension 12/13/2012    Priority: High  . Obesity, Class III, BMI 40-49.9 (morbid obesity) ([place]) 12/12/2012    Priority: High  . Asthma 01/03/2016    Priority: Medium  . Hyperprolactinemia ([place]) 08/20/2015    Priority: Medium  . 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 Family history of colon cancer 12/13/2012    Priority: Medium  . Marital dysfunction 12/20/2016    Priority: Low  . Under emotional stress due to marital discourse  12/20/2016    Priority: Low  . Environmental and seasonal allergies 05/16/2016    Priority: Low  . Low testosterone 05/18/2015    Priority: Low  . Acute gouty arthropathy 10/12/2017  . Vitamin D insufficiency 10/12/2017  . Hypogonadism male 10/12/2017  . Rhinosinusitis 09/27/2017  . Acute right ankle pain 03/28/2017  . Ear popping, right 01/29/2017  . Otitis media with effusion, right 01/29/2017  . Counseling on health promotion and disease prevention 05/16/2016  . Bronchitis 03/22/2016  . Eczema 03/24/2015  . Cough 03/24/2015  . Achilles tendonitis 12/12/2012  . Allergic rhinitis 08/16/2012    Past Medical history, Surgical history, Family history, Social history, Allergies and Medications have been entered into the medical record, reviewed and changed as needed.    Current Meds  Medication Sig  .  albuterol (PROVENTIL HFA;VENTOLIN HFA) 108 (90 Base) MCG/ACT inhaler Inhale 2 puffs into the lungs every 4 (four) hours as needed for wheezing (cough, shortness of breath or wheezing.).  Marland Kitchen cabergoline (DOSTINEX) 0.5 MG tablet Take 1 tablet by mouth 2 (two) times a week.  . clobetasol cream (TEMOVATE)  0.05 % Apply topically 2 (two) times daily.  . clomiPHENE (CLOMID) 50 MG tablet Take 1 tablet by mouth daily.  . colchicine 0.6 MG tablet 1.2 mg p.o. X 1  Then 0.6 mg p.o. 1 hour later x 1 at first onset of gouty flare.  . fluticasone (FLONASE) 50 MCG/ACT nasal spray Place 1 spray into both nostrils 2 (two) times daily.  . metFORMIN (GLUCOPHAGE-XR) 500 MG 24 hr tablet Take 1 tablet by mouth daily with supper.  . mometasone-formoterol (DULERA) 200-5 MCG/ACT AERO Inhale 2 puffs into the lungs daily.  . montelukast (SINGULAIR) 10 MG tablet Take 1 tablet (10 mg total) by mouth at bedtime.  . [DISCONTINUED] atenolol-chlorthalidone (TENORETIC) 50-25 MG tablet Take 0.5 tablets by mouth daily.    Allergies:  Allergies  Allergen Reactions  . Bee Venom      Review of Systems:  A fourteen system review of systems was performed and found to be positive as per HPI.   Objective:   Blood pressure 123/76, pulse 77, height 6' (1.829 m), weight 279 lb (126.6 kg), SpO2 98 %. Body mass index is 37.84 kg/m. General:  Well Developed, well nourished, appropriate for stated age.  Neuro:  Alert and oriented,  extra-ocular muscles intact  HEENT:  Normocephalic, atraumatic, neck supple, no carotid bruits appreciated  Skin:  no gross rash, warm, pink. Cardiac:  RRR, S1 S2 Respiratory:  ECTA B/L and A/P, Not using accessory muscles, speaking in full sentences- unlabored. Vascular:  Ext warm, no cyanosis apprec.; cap RF less 2 sec. Psych:  No HI/SI, judgement and insight good, Euthymic mood. Full Affect.

## 2017-10-12 NOTE — Patient Instructions (Addendum)
If taking ibuprofen for any acute pain attacks or headache etc. please also take ranitidine which is Zantac over-the-counter along with it to help protect your gastric mucosa.  Low purine diet--> see handout I printed for you.   Cont wt loss  Exercise  For your vitamin D please get your prescription vitamin D and start taking that at least once a week.

## 2017-10-13 LAB — CBC WITH DIFFERENTIAL/PLATELET
Basophils Absolute: 0.1 10*3/uL (ref 0.0–0.2)
Basos: 1 %
EOS (ABSOLUTE): 0.3 10*3/uL (ref 0.0–0.4)
Eos: 4 %
Hematocrit: 42.5 % (ref 37.5–51.0)
Hemoglobin: 13.7 g/dL (ref 13.0–17.7)
Immature Grans (Abs): 0 10*3/uL (ref 0.0–0.1)
Immature Granulocytes: 0 %
Lymphocytes Absolute: 3.3 10*3/uL — ABNORMAL HIGH (ref 0.7–3.1)
Lymphs: 35 %
MCH: 28 pg (ref 26.6–33.0)
MCHC: 32.2 g/dL (ref 31.5–35.7)
MCV: 87 fL (ref 79–97)
Monocytes Absolute: 1.1 10*3/uL — ABNORMAL HIGH (ref 0.1–0.9)
Monocytes: 12 %
Neutrophils Absolute: 4.6 10*3/uL (ref 1.4–7.0)
Neutrophils: 48 %
Platelets: 296 10*3/uL (ref 150–379)
RBC: 4.9 x10E6/uL (ref 4.14–5.80)
RDW: 13.9 % (ref 12.3–15.4)
WBC: 9.4 10*3/uL (ref 3.4–10.8)

## 2017-10-13 LAB — URIC ACID: Uric Acid: 9.5 mg/dL — ABNORMAL HIGH (ref 3.7–8.6)

## 2017-10-15 ENCOUNTER — Telehealth: Payer: Self-pay | Admitting: Family Medicine

## 2017-10-15 NOTE — Telephone Encounter (Signed)
Patient started the medication on Friday.  Patient states that after starting the medication he has developed stuffy nose, dry cough, and some fatigue.  Patient would like to know if this is a side effect of the medication how long would it last until his system adjusts to the medication.  Please advise. Thanks. MPulliam, CMA/RT(R)

## 2017-10-15 NOTE — Telephone Encounter (Signed)
Please let David Aguilar know that this is extremely rare (One that I have never heard of patient have concerns about) but any side effect is potentially possible in any patient, from any medication.   I think there is a very, very slim chance it will continue.   And if he can tolerate these new symptoms I recommend he do so.  Also, at this time a year and with his medical history, it could be just coinciding symptoms of seasonal allergies that are getting exacerbated from the nicer weather we had last week as well.

## 2017-10-15 NOTE — Telephone Encounter (Signed)
Patient was started on losartan and read that there is a side effect that could develop similar to cold symptoms. He wants to know if this is something that will happen continually as he takes this med or just a small chance. He said you can call him back or send a response via MyChart. Please advise

## 2017-10-16 NOTE — Telephone Encounter (Signed)
Called and notified the patient. MPulliam, CMA/RT(R)  

## 2017-11-13 DIAGNOSIS — F4323 Adjustment disorder with mixed anxiety and depressed mood: Secondary | ICD-10-CM | POA: Diagnosis not present

## 2017-12-10 DIAGNOSIS — E291 Testicular hypofunction: Secondary | ICD-10-CM | POA: Diagnosis not present

## 2017-12-10 DIAGNOSIS — R7301 Impaired fasting glucose: Secondary | ICD-10-CM | POA: Diagnosis not present

## 2017-12-10 DIAGNOSIS — D443 Neoplasm of uncertain behavior of pituitary gland: Secondary | ICD-10-CM | POA: Diagnosis not present

## 2017-12-10 DIAGNOSIS — E221 Hyperprolactinemia: Secondary | ICD-10-CM | POA: Diagnosis not present

## 2017-12-18 ENCOUNTER — Ambulatory Visit: Payer: BLUE CROSS/BLUE SHIELD | Admitting: Family Medicine

## 2017-12-18 ENCOUNTER — Encounter: Payer: Self-pay | Admitting: Family Medicine

## 2017-12-18 VITALS — BP 119/80 | HR 82 | Ht 72.0 in | Wt 286.0 lb

## 2017-12-18 DIAGNOSIS — J3089 Other allergic rhinitis: Secondary | ICD-10-CM | POA: Diagnosis not present

## 2017-12-18 DIAGNOSIS — M109 Gout, unspecified: Secondary | ICD-10-CM | POA: Diagnosis not present

## 2017-12-18 DIAGNOSIS — J309 Allergic rhinitis, unspecified: Secondary | ICD-10-CM

## 2017-12-18 DIAGNOSIS — R0982 Postnasal drip: Secondary | ICD-10-CM

## 2017-12-18 MED ORDER — PREDNISONE 20 MG PO TABS: ORAL_TABLET | ORAL | 0 refills | Status: DC

## 2017-12-18 MED ORDER — ALLOPURINOL 100 MG PO TABS: ORAL_TABLET | ORAL | 1 refills | Status: DC

## 2017-12-18 MED ORDER — COLCHICINE 0.6 MG PO TABS: ORAL_TABLET | ORAL | 0 refills | Status: DC

## 2017-12-18 NOTE — Progress Notes (Signed)
Pt here for an acute care OV today   Impression and Recommendations:    1. Acute gouty arthropathy- b/l ankles   2. Environmental and seasonal allergies   3. Allergic rhinitis with postnasal drip-causing cough at night esp     1. Acute gouty arthropathy- b/l ankles -check CBC and uric acid. -low purine diet encouraged. -Start allopurinol. Will monitor kidney and liver function.  -start steroids. -continue colchicine.  -Pt declines referral to sports medicine at this time.  -Drink adequate amounts of water per day, equal to half of your wt in oz per day.   2. Env/seasonal allergies/allergic rhinitis -continue neti pot but add one spray of flonase in each nostril.   -Continue regular OTC allergy medications.    Meds ordered this encounter  Medications  . predniSONE (DELTASONE) 20 MG tablet    Sig: Take 3 tabs po * 2 days, then 2 tabs for 2 d, then 1 tab 2 d, then 1/2 tab 2 days.    Dispense:  15 tablet    Refill:  0  . allopurinol (ZYLOPRIM) 100 MG tablet    Sig: 1 p.o. daily x1 week then increase to twice daily    Dispense:  180 tablet    Refill:  1  . colchicine 0.6 MG tablet    Sig: 1.2 mg p.o. X 1  Then 0.6 mg p.o. 1 hour later x 1 at first onset of gouty flare.    Dispense:  20 tablet    Refill:  0    Orders Placed This Encounter  Procedures  . Uric acid  . CBC with Differential/Platelet     Education and routine counseling performed. Handouts provided  Gross side effects, risk and benefits, and alternatives of medications and treatment plan in general discussed with patient.  Patient is aware that all medications have potential side effects and we are unable to predict every side effect or drug-drug interaction that may occur.   Patient will call with any questions prior to using medication if they have concerns.  Expresses verbal understanding and consents to current therapy and treatment regimen.  No barriers to understanding were identified.  Red flag  symptoms and signs discussed in detail.  Patient expressed understanding regarding what to do in case of emergency\urgent symptoms   Please see AVS handed out to patient at the end of our visit for further patient instructions/ counseling done pertaining to today's office visit.   Return for  follow-up OV with me 6 weeks after starting txmnt- reck uric acid then. .     Note: This document was prepared occasionally using Dragon voice recognition software and may include unintentional dictation errors in addition to a scribe.  This document serves as a record of services personally performed by Mellody Dance, DO. It was created on her behalf by Mayer Masker, a trained medical scribe. The creation of this record is based on the scribe's personal observations and the provider's statements to them.   I have reviewed the above medical documentation for accuracy and completeness and I concur.  Mellody Dance 48/22/19 8:08 AM  --------------------------------------------------------------------------------------------------------------------------------------------------------------------------------------------------------------------------------------------    Subjective:    CC:  Chief Complaint  Patient presents with  . Gout    HPI: David Aguilar is a 47 y.o. male who presents to [place] [facility] today for L ankle/foot pain.  He has a PMHx of gout flare up. He has had 5 gout flare ups since last year.  He was seen 10-12-17 for a similar gout flareup.   He complains of L ankle pain that began yesterday. It has slowly improved over time. He took 4 colchicine which helped improved his pain. This medicine upset his stomach.   He has reduced intake of red meats but notes he is eating a lot of pork, mushrooms, and asparagus.   He is not taking allopurinol or indomethacin.   No problems updated.   Wt Readings from Last 3 Encounters:  12/18/17 286 lb  (129.7 kg)  10/12/17 279 lb (126.6 kg)  09/27/17 286 lb 12.8 oz (130.1 kg)   BP Readings from Last 3 Encounters:  12/18/17 119/80  10/12/17 123/76  09/27/17 117/80   BMI Readings from Last 3 Encounters:  12/18/17 38.79 kg/m  10/12/17 37.84 kg/m  09/27/17 38.90 kg/m     Patient Care Team    Relationship Specialty Notifications Start End  Mellody Dance, DO PCP - General Family Medicine  01/03/16   Jacelyn Pi, MD Consulting Physician Endocrinology  08/20/15   Carolan Clines, MD Consulting Physician Urology  05/16/16      Patient Active Problem List   Diagnosis Date Noted  . Prediabetes 05/16/2016    Priority: High  . Essential hypertension 12/13/2012    Priority: High  . Obesity, Class III, BMI 40-49.9 (morbid obesity) ([place]) 12/12/2012    Priority: High  . Asthma 01/03/2016    Priority: Medium  . Hyperprolactinemia ([place]) 08/20/2015    Priority: Medium  . 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 Family history of colon cancer 12/13/2012    Priority: Medium  . Marital dysfunction 12/20/2016    Priority: Low  . Under emotional stress due to marital discourse  12/20/2016    Priority: Low  . Environmental and seasonal allergies 05/16/2016    Priority: Low  . Low testosterone 05/18/2015    Priority: Low  . Acute gouty arthropathy 10/12/2017  . Vitamin D insufficiency 10/12/2017  . Hypogonadism male 10/12/2017  . Rhinosinusitis 09/27/2017  . Acute right ankle pain 03/28/2017  . Ear popping, right 01/29/2017  . Otitis media with effusion, right 01/29/2017  . Counseling on health promotion and disease prevention 05/16/2016  . Bronchitis 03/22/2016  . Eczema 03/24/2015  . Cough 03/24/2015  . Achilles tendonitis 12/12/2012  . Allergic rhinitis with postnasal drip 08/16/2012    Past Medical history, Surgical history, Family history, Social history, Allergies and Medications have been entered into the medical  record, reviewed and changed as needed.    Current Meds  Medication Sig  . albuterol (PROVENTIL HFA;VENTOLIN HFA) 108 (90 Base) MCG/ACT inhaler Inhale 2 puffs into the lungs every 4 (four) hours as needed for wheezing (cough, shortness of breath or wheezing.).  Marland Kitchen cabergoline (DOSTINEX) 0.5 MG tablet Take 1 tablet by mouth 2 (two) times a week.  . clobetasol cream (TEMOVATE) 0.05 % Apply topically 2 (two) times daily.  . clomiPHENE (CLOMID) 50 MG tablet Take 1 tablet by mouth daily.  . colchicine 0.6 MG tablet 1.2 mg p.o. X 1  Then 0.6 mg p.o. 1 hour later x 1 at first onset of gouty flare.  . fluticasone (FLONASE) 50 MCG/ACT nasal spray Place 1 spray into both nostrils 2 (two) times daily.  Marland Kitchen losartan (COZAAR) 100 MG tablet Take 0.5 tablets (50 mg total) by mouth daily.  . metFORMIN (GLUCOPHAGE-XR) 500 MG 24 hr tablet Take 1 tablet by mouth daily with supper.  . mometasone-formoterol (  DULERA) 200-5 MCG/ACT AERO Inhale 2 puffs into the lungs daily.  . montelukast (SINGULAIR) 10 MG tablet Take 1 tablet (10 mg total) by mouth at bedtime.  . Vitamin D, Ergocalciferol, (DRISDOL) 50000 units CAPS capsule Take 1 capsule (50,000 Units total) by mouth every 7 (seven) days.  . [DISCONTINUED] colchicine 0.6 MG tablet 1.2 mg p.o. X 1  Then 0.6 mg p.o. 1 hour later x 1 at first onset of gouty flare.  . [DISCONTINUED] ibuprofen (ADVIL,MOTRIN) 600 MG tablet Take 1 tablet (600 mg total) by mouth every 8 (eight) hours as needed.    Allergies:  Allergies  Allergen Reactions  . Bee Venom      Review of Systems: General:   Denies fever, chills, unexplained weight loss.  Optho/Auditory:   Denies visual changes, blurred vision/LOV Respiratory:   Denies wheeze, DOE more than baseline levels.  Cardiovascular:   Denies chest pain, palpitations, new onset peripheral edema  Gastrointestinal:   Denies nausea, vomiting, abd pain.  Genitourinary: Denies dysuria, freq/ urgency, flank pain or discharge from  genitals.  Endocrine:     Denies hot or cold intolerance, polyuria, polydipsia. Musculoskeletal:   Denies unexplained myalgias, joint swelling, gait problems.  Skin:  Denies new onset rash, suspicious lesions Neurological:     Denies dizziness, unexplained weakness, numbness  Psychiatric/Behavioral:   Denies mood changes, suicidal or homicidal ideations, hallucinations    Objective:   Blood pressure 119/80, pulse 82, height 6' (1.829 m), weight 286 lb (129.7 kg), SpO2 98 %. Body mass index is 38.79 kg/m. General:  Well Developed, well nourished, appropriate for stated age.  Neuro:  Alert and oriented,  extra-ocular muscles intact  HEENT:  Normocephalic, atraumatic, neck supple Skin:  no gross rash, warm, pink. Cardiac:  RRR, S1 S2 Respiratory:  ECTA B/L and A/P, Not using accessory muscles, speaking in full sentences- unlabored. Vascular:  Ext warm, no cyanosis apprec.; cap RF less 2 sec. Psych:  No HI/SI, judgement and insight good, Euthymic mood. Full Affect.

## 2017-12-18 NOTE — Patient Instructions (Addendum)
Low-Purine Diet Purines are compounds that affect the level of uric acid in your body. A low-purine diet is a diet that is low in purines. Eating a low-purine diet can prevent the level of uric acid in your body from getting too high and causing gout or kidney stones or both. What do I need to know about this diet?  Choose low-purine foods. Examples of low-purine foods are listed in the next section.  Drink plenty of fluids, especially water. Fluids can help remove uric acid from your body. Try to drink 8-16 cups (1.9-3.8 L) a day.  Limit foods high in fat, especially saturated fat, as fat makes it harder for the body to get rid of uric acid. Foods high in saturated fat include pizza, cheese, ice cream, whole milk, fried foods, and gravies. Choose foods that are lower in fat and lean sources of protein. Use olive oil when cooking as it contains healthy fats that are not high in saturated fat.  Limit alcohol. Alcohol interferes with the elimination of uric acid from your body. If you are having a gout attack, avoid all alcohol.  Keep in mind that different people's bodies react differently to different foods. You will probably learn over time which foods do or do not affect you. If you discover that a food tends to cause your gout to flare up, avoid eating that food. You can more freely enjoy foods that do not cause problems. If you have any questions about a food item, talk to your dietitian or health care provider. Which foods are low, moderate, and high in purines? The following is a list of foods that are low, moderate, and high in purines. You can eat any amount of the foods that are low in purines. You may be able to have small amounts of foods that are moderate in purines. Ask your health care provider how much of a food moderate in purines you can have. Avoid foods high in purines. Grains  Foods low in purines: Enriched white bread, pasta, rice, cake, cornbread, popcorn.  Foods moderate in  purines: Whole-grain breads and cereals, wheat germ, bran, oatmeal. Uncooked oatmeal. Dry wheat bran or wheat germ.  Foods high in purines: Pancakes, French toast, biscuits, muffins. Vegetables  Foods low in purines: All vegetables, except those that are moderate in purines.  Foods moderate in purines: Asparagus, cauliflower, spinach, mushrooms, green peas. Fruits  All fruits are low in purines. Meats and other Protein Foods  Foods low in purines: Eggs, nuts, peanut butter.  Foods moderate in purines: 80-90% lean beef, lamb, veal, pork, poultry, fish, eggs, peanut butter, nuts. Crab, lobster, oysters, and shrimp. Cooked dried beans, peas, and lentils.  Foods high in purines: Anchovies, sardines, herring, mussels, tuna, codfish, scallops, trout, and haddock. Bacon. Organ meats (such as liver or kidney). Tripe. Game meat. Goose. Sweetbreads. Dairy  All dairy foods are low in purines. Low-fat and fat-free dairy products are best because they are low in saturated fat. Beverages  Drinks low in purines: Water, carbonated beverages, tea, coffee, cocoa.  Drinks moderate in purines: Soft drinks and other drinks sweetened with high-fructose corn syrup. Juices. To find whether a food or drink is sweetened with high-fructose corn syrup, look at the ingredients list.  Drinks high in purines: Alcoholic beverages (such as beer). Condiments  Foods low in purines: Salt, herbs, olives, pickles, relishes, vinegar.  Foods moderate in purines: Butter, margarine, oils, mayonnaise. Fats and Oils  Foods low in purines: All types, except gravies   and sauces made with meat.  Foods high in purines: Gravies and sauces made with meat. Other Foods  Foods low in purines: Sugars, sweets, gelatin. Cake. Soups made without meat.  Foods moderate in purines: Meat-based or fish-based soups, broths, or bouillons. Foods and drinks sweetened with high-fructose corn syrup.  Foods high in purines: High-fat desserts  (such as ice cream, cookies, cakes, pies, doughnuts, and chocolate). Contact your dietitian for more information on foods that are not listed here. This information is not intended to replace advice given to you by your health care provider. Make sure you discuss any questions you have with your health care provider. Document Released: 11/11/2010 Document Revised: 12/23/2015 Document Reviewed: 06/23/2013 Elsevier Interactive Patient Education  2017 [employer].   Also, sterile saline nasal rinses, such as Milta Deiters med or AYR sinus rinses, can be very helpful and should be done twice daily- especially throughout the allergy season.   Remember you should use distilled water or previously boiled water to do this.  Then you may use over-the-counter Flonase 1 spray each nostril twice daily after sinus rinses.  You can do this in addition to taking any Allegra or Claritin or Zyrtec etc. that you may be taking daily.  Also for short-term management when your congestion really flares up please switch to the Allegra-D for a week or 2 and then back to the regular Allegra.  If your eyes tend to get an itchy or irritated feeling when your seasonal allergies get bad, you can use Naphcon-A over-the-counter eyedrops as needed

## 2017-12-19 LAB — URIC ACID: Uric Acid: 7.1 mg/dL (ref 3.7–8.6)

## 2017-12-19 LAB — CBC WITH DIFFERENTIAL/PLATELET
Basophils Absolute: 0 10*3/uL (ref 0.0–0.2)
Basos: 0 %
EOS (ABSOLUTE): 0.3 10*3/uL (ref 0.0–0.4)
Eos: 4 %
Hematocrit: 39 % (ref 37.5–51.0)
Hemoglobin: 13.1 g/dL (ref 13.0–17.7)
Immature Grans (Abs): 0 10*3/uL (ref 0.0–0.1)
Immature Granulocytes: 0 %
Lymphocytes Absolute: 3.4 10*3/uL — ABNORMAL HIGH (ref 0.7–3.1)
Lymphs: 36 %
MCH: 28 pg (ref 26.6–33.0)
MCHC: 33.6 g/dL (ref 31.5–35.7)
MCV: 83 fL (ref 79–97)
Monocytes Absolute: 1.1 10*3/uL — ABNORMAL HIGH (ref 0.1–0.9)
Monocytes: 11 %
Neutrophils Absolute: 4.6 10*3/uL (ref 1.4–7.0)
Neutrophils: 49 %
Platelets: 257 10*3/uL (ref 150–450)
RBC: 4.68 x10E6/uL (ref 4.14–5.80)
RDW: 14 % (ref 12.3–15.4)
WBC: 9.5 10*3/uL (ref 3.4–10.8)

## 2017-12-28 ENCOUNTER — Other Ambulatory Visit: Payer: Self-pay | Admitting: Family Medicine

## 2017-12-28 DIAGNOSIS — J309 Allergic rhinitis, unspecified: Secondary | ICD-10-CM

## 2017-12-28 DIAGNOSIS — R062 Wheezing: Secondary | ICD-10-CM

## 2017-12-28 DIAGNOSIS — Z9109 Other allergy status, other than to drugs and biological substances: Secondary | ICD-10-CM

## 2018-01-01 DIAGNOSIS — F4323 Adjustment disorder with mixed anxiety and depressed mood: Secondary | ICD-10-CM | POA: Diagnosis not present

## 2018-03-26 DIAGNOSIS — F4323 Adjustment disorder with mixed anxiety and depressed mood: Secondary | ICD-10-CM | POA: Diagnosis not present

## 2018-05-07 DIAGNOSIS — F4323 Adjustment disorder with mixed anxiety and depressed mood: Secondary | ICD-10-CM | POA: Diagnosis not present

## 2018-05-30 DIAGNOSIS — R7301 Impaired fasting glucose: Secondary | ICD-10-CM | POA: Diagnosis not present

## 2018-05-30 DIAGNOSIS — D443 Neoplasm of uncertain behavior of pituitary gland: Secondary | ICD-10-CM | POA: Diagnosis not present

## 2018-05-30 DIAGNOSIS — E291 Testicular hypofunction: Secondary | ICD-10-CM | POA: Diagnosis not present

## 2018-05-30 DIAGNOSIS — E221 Hyperprolactinemia: Secondary | ICD-10-CM | POA: Diagnosis not present

## 2018-06-11 DIAGNOSIS — F4323 Adjustment disorder with mixed anxiety and depressed mood: Secondary | ICD-10-CM | POA: Diagnosis not present

## 2018-06-12 DIAGNOSIS — Z23 Encounter for immunization: Secondary | ICD-10-CM | POA: Diagnosis not present

## 2018-06-12 DIAGNOSIS — E221 Hyperprolactinemia: Secondary | ICD-10-CM | POA: Diagnosis not present

## 2018-06-12 DIAGNOSIS — E291 Testicular hypofunction: Secondary | ICD-10-CM | POA: Diagnosis not present

## 2018-06-12 DIAGNOSIS — I10 Essential (primary) hypertension: Secondary | ICD-10-CM | POA: Diagnosis not present

## 2018-06-12 DIAGNOSIS — D443 Neoplasm of uncertain behavior of pituitary gland: Secondary | ICD-10-CM | POA: Diagnosis not present

## 2018-06-18 DIAGNOSIS — F4323 Adjustment disorder with mixed anxiety and depressed mood: Secondary | ICD-10-CM | POA: Diagnosis not present

## 2018-07-01 ENCOUNTER — Encounter: Payer: Self-pay | Admitting: Family Medicine

## 2018-07-01 ENCOUNTER — Ambulatory Visit: Payer: BLUE CROSS/BLUE SHIELD | Admitting: Family Medicine

## 2018-07-01 VITALS — BP 128/86 | HR 70 | Temp 97.8°F | Ht 72.0 in | Wt 292.0 lb

## 2018-07-01 DIAGNOSIS — R062 Wheezing: Secondary | ICD-10-CM

## 2018-07-01 DIAGNOSIS — H8301 Labyrinthitis, right ear: Secondary | ICD-10-CM | POA: Diagnosis not present

## 2018-07-01 DIAGNOSIS — J4540 Moderate persistent asthma, uncomplicated: Secondary | ICD-10-CM

## 2018-07-01 DIAGNOSIS — J40 Bronchitis, not specified as acute or chronic: Secondary | ICD-10-CM

## 2018-07-01 DIAGNOSIS — R059 Cough, unspecified: Secondary | ICD-10-CM

## 2018-07-01 DIAGNOSIS — J0140 Acute pansinusitis, unspecified: Secondary | ICD-10-CM

## 2018-07-01 DIAGNOSIS — R05 Cough: Secondary | ICD-10-CM

## 2018-07-01 MED ORDER — AMOXICILLIN-POT CLAVULANATE 875-125 MG PO TABS: 1.0000 | ORAL_TABLET | Freq: Two times a day (BID) | ORAL | 0 refills | Status: AC

## 2018-07-01 MED ORDER — METHYLPREDNISOLONE ACETATE 40 MG/ML IJ SUSP
40.0000 mg | Freq: Once | INTRAMUSCULAR | Status: AC
  Administered 2018-07-01: 40 mg via INTRAMUSCULAR

## 2018-07-01 MED ORDER — HYDROCOD POLST-CPM POLST ER 10-8 MG/5ML PO SUER: 5.0000 mL | Freq: Two times a day (BID) | ORAL | 0 refills | Status: DC | PRN

## 2018-07-01 MED ORDER — PREDNISONE 20 MG PO TABS: ORAL_TABLET | ORAL | 0 refills | Status: DC

## 2018-07-01 MED ORDER — ALBUTEROL SULFATE HFA 108 (90 BASE) MCG/ACT IN AERS: 2.0000 | INHALATION_SPRAY | RESPIRATORY_TRACT | 2 refills | Status: DC | PRN

## 2018-07-01 MED ORDER — DEXAMETHASONE SODIUM PHOSPHATE 4 MG/ML IJ SOLN
4.0000 mg | Freq: Once | INTRAMUSCULAR | Status: AC
  Administered 2018-07-01: 4 mg via INTRAMUSCULAR

## 2018-07-01 NOTE — Progress Notes (Signed)
Acute Care Office visit  Assessment and plan:  1. Acute pansinusitis, recurrence not specified   2. Otitis interna, right   3. Moderate persistent asthma, unspecified whether complicated   4. Wheezing   5. Bronchitis   6. Cough     - Viral vs Allergic vs Bacterial causes for pt's symptoms reveiwed.    - Supportive care and various OTC medications discussed in addition to any prescribed.  - Strongly advised patient to discontinue use of Afrin nasal spray.   - Discussed the risks and benefits of using Afrin.  Reviewed that it is such a potent vasoconstrictor that it causes a rebound effect that can make congestion much worse once the effect wears off.  - Advised the patient to begin using AYR or Neilmed sinus rinses BID followed by flonase BID (one spray to each nostril).  Advised that the patient may also incorporate allegra or claritin PRN.   - Steroid injection prescribed today. - Augmentin prescribed today.  See med list today. - Prednisone taper prescribed for use starting tomorrow. - Tussionex prescribed.  - Call or RTC if new symptoms, or if no improvement or worse over next several days.   Meds ordered this encounter  Medications  . albuterol (PROVENTIL HFA;VENTOLIN HFA) 108 (90 Base) MCG/ACT inhaler    Sig: Inhale 2 puffs into the lungs every 4 (four) hours as needed for wheezing (cough, shortness of breath or wheezing.).    Dispense:  1 Inhaler    Refill:  2  . predniSONE (DELTASONE) 20 MG tablet    Sig: Take 3 pills a day for 2 days, 2 pills a day for 2 days, 1 pill a day for 2 days then one half pill a day for 2 days then off    Dispense:  14 tablet    Refill:  0  . amoxicillin-clavulanate (AUGMENTIN) 875-125 MG tablet    Sig: Take 1 tablet by mouth 2 (two) times daily for 10 days.    Dispense:  20 tablet    Refill:  0  . chlorpheniramine-HYDROcodone (TUSSIONEX) 10-8 MG/5ML SUER    Sig: Take 5 mLs by mouth every 12 (twelve) hours as needed for cough (cough,  will cause drowsiness.).    Dispense:  200 mL    Refill:  0    Medications Discontinued During This Encounter  Medication Reason  . colchicine 0.6 MG tablet No longer needed (for PRN medications)  . predniSONE (DELTASONE) 20 MG tablet Completed Course  . albuterol (PROVENTIL HFA;VENTOLIN HFA) 108 (90 Base) MCG/ACT inhaler Reorder     No orders of the defined types were placed in this encounter.   Gross side effects, risk and benefits, and alternatives of medications discussed with patient.  Patient is aware that all medications have potential side effects and we are unable to predict every sideeffect or drug-drug interaction that may occur.  Expresses verbal understanding and consents to current therapy plan and treatment regiment.   Education and routine counseling performed. Handouts provided.  Anticipatory guidance and routine counseling done re: condition, txmnt options and need for follow up. All questions of patient's were answered.  Return for Follow-up for chronic care-   for FBW and CPE near future.  Please see AVS handed out to patient at the end of our visit for additional patient instructions/ counseling done pertaining to today's office visit.  Note:  This document was partially repared using Dragon voice recognition software and may include unintentional dictation errors.  This document  serves as a record of services personally performed by Mellody Dance, DO. It was created on her behalf by Toni Amend, a trained medical scribe. The creation of this record is based on the scribe's personal observations and the provider's statements to them.   I have reviewed the above medical documentation for accuracy and completeness and I concur.  Mellody Dance, DO 07/03/2018 8:55 AM       Subjective:    Chief Complaint  Patient presents with  . Sinus Problem    HPI:  Pt presents with Sx for 8-9 days, worse over the past 3-4 days.   C/o: SOB, wheezing, "the  crud," "blowing out the green."  Has been coughing to clear his throat, but "not really coughing anything up."  Denies: One-sided face pain, fevers.  Denies SOB down in his chest; denies chest pain.  Denies bloody noses, "not lately."    For symptoms patient has tried: He has been using Afrin nasal spray.  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  Notes he used to do it 4-6 times per day.  Has been taking Afrin nasal spray "just at night."  Overall getting: Symptoms worsening over the past 3-4 days.  Noticed that the kids have been coughing a lot.    Patient Care Team    Relationship Specialty Notifications Start End  Mellody Dance, DO PCP - General Family Medicine  01/03/16   Jacelyn Pi, MD Consulting Physician Endocrinology  08/20/15   Carolan Clines, MD Consulting Physician Urology  05/16/16     Past medical history, Surgical history, Family history reviewed and noted below, Social history, Allergies, and Medications have been entered into the medical record, reviewed and changed as needed.   Allergies  Allergen Reactions  . Bee Venom     Review of Systems: - see above HPI for pertinent positives General:   No F/C, wt loss Pulm:   No DIB, pleuritic chest pain Card:  No CP, palpitations Abd:  No n/v/d or pain Ext:  No inc edema from baseline   Objective:   Blood pressure (!) 135/96, pulse 70, temperature 97.8 F (36.6 C), height 6' (1.829 m), weight 292 lb (132.5 kg), SpO2 97 %. Body mass index is 39.6 kg/m. General: Well Developed, well nourished, appropriate for stated age.  Neuro: Alert and oriented x3, extra-ocular muscles intact, sensation grossly intact.  HEENT: Normocephalic, atraumatic, pupils equal round reactive to light, neck supple, no masses, no painful lymphadenopathy, TM's intact B/L, Right TM is opacified with obscured anatomy. Nares- patent, clear d/c, turbinates and nasal passages are very edematous, swollen, and  erythematous, with copious amounts of clear fluid.  OP- clear, mild erythema, No TTP sinuses Skin: Warm and dry, no gross rash. Cardiac: RRR, S1 S2,  no murmurs rubs or gallops.  Respiratory: ECTA B/L and A/P, sounds a little more rhonchorous/coarse breath sounds on left basilar lung. Not using accessory muscles, speaking in full sentences- unlabored. Vascular:  No gross lower ext edema, cap RF less 2 sec. Psych: No HI/SI, judgement and insight good, Euthymic mood. Full Affect.

## 2018-07-01 NOTE — Patient Instructions (Addendum)
Melissa, please give him injections of 4 dexamethasone and 40 of Depo-Medrol prior to leaving the office.  Please remind him he will start the prednisone tomorrow since he is getting injection of steroids today.  Again as discussed please do sinus rinses twice daily followed by 1 spray Flonase each nostril and please try to come off the Afrin nasal spray.  -Also please follow-up for a complete physical exam in the near future at your convenience.  This should include fasting blood work since is been over a year and almost 3 months since we last obtained cholesterol, etc. etc.     Affrin/  Oxymetazoline nasal spray What is this medicine? Oxymetazoline (OX ee me TAZ oh leen) is a nasal decongestant. This medicine is used to treat nasal congestion or a stuffy nose. This medicine will not treat an infection. This medicine may be used for other purposes; ask your health care provider or pharmacist if you have questions. COMMON BRAND NAME(S): 12 Hour Nasal, Afrin, Afrin Extra Moisturizing, Afrin Nasal Sinus, Dristan, Duration, Genasal, Mucinex Full Force, Mucinex Moisture Smart, Mucinex Sinus-Max, Nasal Relief, Neo-Synephrine 12-Hour, Neo-Synephrine Severe Sinus Congestion, Nostrilla Fast Relief, Sinex 12-Hour, Sudafed OM Sinus Cold Moisturizing, Sudafed OM Sinus Congestion Moisturizing, Vicks Qlearquil Decongestant, Vicks Sinex, Vicks Sinex Severe, Vicks Sinus Daytime, Zicam Extreme Congestion Relief, Zicam Intense Sinus What should I tell my health care provider before I take this medicine? They need to know if you have any of these conditions: -diabetes -glaucoma -heart disease -high or low blood pressure -history of stroke -Raynaud's phenomenon -scleroderma -Sjogren's syndrome -thromboangiitis obliterans -thyroid disease -trouble urinating due to an enlarged prostate gland -an unusual or allergic reaction to oxymetazoline, other medicines, foods, dyes, or preservatives -pregnant or trying  to get pregnant -breast-feeding How should I use this medicine? This medicine is for use in the nose. Do not take by mouth. Follow the directions on the package label. Shake well before using. Use your medicine at regular intervals or as directed by your health care provider. Do not use it more often than directed. Do not use for more than 3 days in a row without advice. Make sure that you are using your nasal spray correctly. Ask your doctor or health care provider if you have any questions. Talk to your pediatrician regarding the use of this medicine in children. While this drug may be prescribed for children as young as 6 years for selected conditions, precautions do apply. Overdosage: If you think you have taken too much of this medicine contact a poison control center or emergency room at once. NOTE: This medicine is only for you. Do not share this medicine with others. What if I miss a dose? If you miss a dose, use it as soon as you can. If it is almost time for your next dose, use only that dose. Do not use double or extra doses. What may interact with this medicine? The medicine may interaction with the following medications: -MAOIs like isocarboxazid, phenelzine, rasagiline, selegiline, and tranylcypromine -medicines to treat blood pressure and heart disease like ace-inhibitors, beta-blockers, calcium-channel blockers, digoxin, and diuretics -medicines to treat enlarged prostate like alfuzosin, doxazosin, prazosin, and terazosin -nafarelin This list may not describe all possible interactions. Give your health care provider a list of all the medicines, herbs, non-prescription drugs, or dietary supplements you use. Also tell them if you smoke, drink alcohol, or use illegal drugs. Some items may interact with your medicine. What should I watch for while using this medicine?  Tell your doctor or health care professional if your symptoms do not start to get better or if they get worse. To  prevent the spread of infection, do not share bottle with anyone else. What side effects may I notice from receiving this medicine? Side effects that you should report to your doctor or health care professional as soon as possible: -allergic reactions like skin rash, itching or hives, swelling of the face, lips, or tongue Side effects that usually do not require medical attention (report to your doctor or health care professional if they continue or are bothersome): -burning, stinging, or irritation in the nose right after use -increased nasal discharge -sneezing This list may not describe all possible side effects. Call your doctor for medical advice about side effects. You may report side effects to FDA at 1-800-FDA-1088. Where should I keep my medicine? Keep out of the reach of children. Store at room temperature between 20 and 25 degrees C (68 and 77 degrees F). Throw away any unused medicine after the expiration date. NOTE: This sheet is a summary. It may not cover all possible information. If you have questions about this medicine, talk to your doctor, pharmacist, or health care provider.  2018 Elsevier/Gold Standard (2015-09-09 13:48:04)

## 2018-07-02 DIAGNOSIS — F4323 Adjustment disorder with mixed anxiety and depressed mood: Secondary | ICD-10-CM | POA: Diagnosis not present

## 2018-08-07 DIAGNOSIS — F4323 Adjustment disorder with mixed anxiety and depressed mood: Secondary | ICD-10-CM | POA: Diagnosis not present

## 2018-08-13 ENCOUNTER — Other Ambulatory Visit: Payer: Self-pay | Admitting: Family Medicine

## 2018-08-13 DIAGNOSIS — M109 Gout, unspecified: Secondary | ICD-10-CM

## 2018-09-03 DIAGNOSIS — F4323 Adjustment disorder with mixed anxiety and depressed mood: Secondary | ICD-10-CM | POA: Diagnosis not present

## 2018-10-18 ENCOUNTER — Other Ambulatory Visit: Payer: Self-pay | Admitting: Family Medicine

## 2018-10-18 DIAGNOSIS — I10 Essential (primary) hypertension: Secondary | ICD-10-CM

## 2019-01-09 ENCOUNTER — Other Ambulatory Visit: Payer: Self-pay | Admitting: Family Medicine

## 2019-01-09 DIAGNOSIS — J309 Allergic rhinitis, unspecified: Secondary | ICD-10-CM

## 2019-01-09 DIAGNOSIS — R062 Wheezing: Secondary | ICD-10-CM

## 2019-01-09 DIAGNOSIS — Z9109 Other allergy status, other than to drugs and biological substances: Secondary | ICD-10-CM

## 2019-01-22 ENCOUNTER — Other Ambulatory Visit: Payer: Self-pay | Admitting: Family Medicine

## 2019-01-23 DIAGNOSIS — F4323 Adjustment disorder with mixed anxiety and depressed mood: Secondary | ICD-10-CM | POA: Diagnosis not present

## 2019-02-05 ENCOUNTER — Telehealth: Payer: Self-pay | Admitting: Family Medicine

## 2019-02-05 NOTE — Telephone Encounter (Signed)
Left pt msg to call of to set up Provider required OV for any future Rx reflls.  --glh

## 2019-02-05 NOTE — Telephone Encounter (Signed)
Noted MPulliam, CMA/RT(R)  

## 2019-02-05 NOTE — Telephone Encounter (Signed)
-----   Message from Jerilee Field, [place] sent at 01/09/2019  3:49 PM EDT ----- Patient is due for follow up, please call the patient to make an appointment. 30 days of meds sent in Thanks. MPulliam, CMA/RT(R)

## 2019-02-12 ENCOUNTER — Other Ambulatory Visit: Payer: Self-pay | Admitting: Family Medicine

## 2019-02-12 DIAGNOSIS — J309 Allergic rhinitis, unspecified: Secondary | ICD-10-CM

## 2019-02-12 DIAGNOSIS — R062 Wheezing: Secondary | ICD-10-CM

## 2019-02-12 DIAGNOSIS — Z9109 Other allergy status, other than to drugs and biological substances: Secondary | ICD-10-CM

## 2019-02-27 ENCOUNTER — Other Ambulatory Visit: Payer: Self-pay

## 2019-02-27 ENCOUNTER — Ambulatory Visit (INDEPENDENT_AMBULATORY_CARE_PROVIDER_SITE_OTHER): Payer: BC Managed Care – PPO | Admitting: Family Medicine

## 2019-02-27 ENCOUNTER — Encounter: Payer: Self-pay | Admitting: Family Medicine

## 2019-02-27 VITALS — BP 126/74 | HR 83 | Temp 97.4°F | Ht 72.0 in | Wt 297.4 lb

## 2019-02-27 DIAGNOSIS — R0989 Other specified symptoms and signs involving the circulatory and respiratory systems: Secondary | ICD-10-CM | POA: Diagnosis not present

## 2019-02-27 DIAGNOSIS — Z630 Problems in relationship with spouse or partner: Secondary | ICD-10-CM

## 2019-02-27 DIAGNOSIS — M109 Gout, unspecified: Secondary | ICD-10-CM | POA: Diagnosis not present

## 2019-02-27 DIAGNOSIS — Z9109 Other allergy status, other than to drugs and biological substances: Secondary | ICD-10-CM

## 2019-02-27 DIAGNOSIS — E559 Vitamin D deficiency, unspecified: Secondary | ICD-10-CM | POA: Diagnosis not present

## 2019-02-27 DIAGNOSIS — J989 Respiratory disorder, unspecified: Secondary | ICD-10-CM

## 2019-02-27 DIAGNOSIS — I10 Essential (primary) hypertension: Secondary | ICD-10-CM | POA: Diagnosis not present

## 2019-02-27 MED ORDER — ALLOPURINOL 100 MG PO TABS: ORAL_TABLET | ORAL | 1 refills | Status: DC

## 2019-02-27 MED ORDER — LOSARTAN POTASSIUM 50 MG PO TABS: ORAL_TABLET | ORAL | 1 refills | Status: DC

## 2019-02-27 MED ORDER — VITAMIN D (ERGOCALCIFEROL) 1.25 MG (50000 UNIT) PO CAPS: ORAL_CAPSULE | ORAL | 3 refills | Status: DC

## 2019-02-27 MED ORDER — MONTELUKAST SODIUM 10 MG PO TABS: ORAL_TABLET | ORAL | 1 refills | Status: DC

## 2019-02-27 NOTE — Progress Notes (Signed)
Virtual / live video office visit note for [employer], D.O- Primary Care Physician at [facility]   I connected with current patient today and beyond visually recognizing the correct individual, I verified that I am speaking with the correct person using two identifiers.  . Location of the patient: Home . Location of the provider: Office Only the patient (+/- their family members at pt's discretion) and myself were participating in the encounter    - This visit type was conducted due to national recommendations for restrictions regarding the COVID-19 Pandemic (e.g. social distancing) in an effort to limit this patient's exposure and mitigate transmission in our community.  This format is felt to be most appropriate for this patient at this time.   - The patient did have access to video technology today yet, we had technical difficulties with this method, requiring transitioning to audio only.    - No physical exam could be performed with this format, beyond that communicated to [place] by the patient/ family members as noted.   - Additionally my office staff/ schedulers discussed with the patient that there may be a monetary charge related to this service, depending on patient's medical insurance.   The patient expressed understanding, and agreed to proceed.      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  Says at the home front, "there's been some changes there."  Notes significant marital distress and notes "it's too stressful."  He feels that he had a breaking point a while back.  Says there has been very little love or intimacy for years, and notes that it's sad because he knows they're both unhappy.  If a separation does occur, he states wants to make it amicable for everyone's  sake, both his and his wife's, and especially the kids.  They have an 59 year old child and 27 year old twins.  The kids have been doing well throughout [place].  Patient tates "they've been pretty resilient."    He feels anxious because of the anticipated backlash of taking any action, because he wants anything they speak about or do to be amicable.  He's concerned about "how it's going to work out."  He feels anxious about anticipated conversations.   1. HTN HPI:  -  His blood pressure has reportedly been controlled at home.  Pt is checking it at home.   States it been fine and he hasn't had any problems with his BP.  Notes his monitor isn't working right now but he hasn't had issues since then.  Notes his BP was 125-127/76-82 last he checked.   Heart rate is in the 70's to low 80's, and gets to the 60's at night.  Trying to drink 96 ounces of water per day.  Says he is trying to focus on this for the past couple of months.  States "I got out of the habit of it, but I'm trying to get back into it."  - Patient reports good compliance with blood pressure medications  - Denies medication S-E   - Smoking Status noted   - He denies new onset of: chest pain, exercise intolerance, shortness of breath, dizziness, visual changes, headache, lower extremity swelling or claudication.   Last 3 blood pressure readings in our office are  as follows: BP Readings from Last 3 Encounters:  02/27/19 126/74  07/01/18 128/86  12/18/17 119/80    Filed Weights   02/27/19 1341  Weight: 297 lb 6.4 oz (134.9 kg)    Gout He is only taking allopurinol once per day, and hasn't had a problem with gout since he had his last batch of episodes (several months ago).   Environmental Allergies Hasn't had sinus infections "or things like that," says "it's been hanging in there."  Says he hasn't been taking Dulera or albuterol for a long time now, "haven't felt like I need it."  Was having nosebleeds and stopped  using Flonase.  Notes he's using Afrin once at night "and that's it."    Impression and Recommendations:    1. Essential hypertension   2. Acute gouty arthropathy- b/l ankles   3. Environmental allergies   4. Reactive airway disease that is not asthma   5. Vitamin D insufficiency     - Need for fasting lab work and CPE in near future.  Essential Hypertension - BP remains well controlled at this time. - Continue treatment plan as established.  See med list below. - Patient tolerating meds well without complication.  Denies S-E  - ongoing prudent lifestyle changes recommended, such as DASH diet and engaging in a regular exercise program discussed with patient.  Educational handouts provided.  - Ambulatory BP monitoring encouraged. Keep log and bring in next OV.  - Will continue to monitor.  Gout - Stable at this time; only has had one flare up in the past 6 months. - Managed on one tablet per day now. - Plan reduced to one tablet per mouth daily.  - Will continue to monitor.  Environmental Allergies & Asthma - Stable at this time; asthma well-controlled - Patient reports not using Dulera or albuterol rescue inhaler at all recently.  - Singular refilled today. - Advised patient to continue taking Singulair as prescribed.  See med list. - Education provided today and questions were answered.  - Will continue to monitor.  Vitamin D Insufficiency - Continue management as prescribed. - Will continue to monitor.  - As part of my medical decision making, I reviewed the following data within the [place] History obtained from pt /family, CMA notes reviewed and incorporated if applicable, Labs reviewed, Radiograph/ tests reviewed if applicable and OV notes from prior OV's with me, as well as other specialists she/he has seen since seeing me last, were all reviewed and used in my medical decision making process today.   - Additionally, discussion had with  patient regarding txmnt plan, their biases about that plan etc were used in my medical decision making today.   - The patient agreed with the plan and demonstrated an understanding of the instructions.   No barriers to understanding were identified.   - Red flag symptoms and signs discussed in detail.  Patient expressed understanding regarding what to do in case of emergency\ urgent symptoms.  The patient was advised to call back or seek an in-person evaluation if the symptoms worsen or if the condition fails to improve as anticipated.  Return for CPE/ yrly physical, come fasting near future.     Meds ordered this encounter  Medications  . Vitamin D, Ergocalciferol, (DRISDOL) 1.25 MG (50000 UT) CAPS capsule    Sig: TAKE 1 CAPSULE BY MOUTH EVERY 7 DAYS.    Dispense:  12 capsule    Refill:  3  . allopurinol (ZYLOPRIM) 100  MG tablet    Sig: TAKE 1 TABLET BY MOUTH ONCE DAILY    Dispense:  90 tablet    Refill:  1  . losartan (COZAAR) 50 MG tablet    Sig: 1 po DAILY.    Dispense:  90 tablet    Refill:  1  . montelukast (SINGULAIR) 10 MG tablet    Sig: TAKE 1 TABLET BY MOUTH AT BEDTIME.    Dispense:  90 tablet    Refill:  1    Medications Discontinued During This Encounter  Medication Reason  . chlorpheniramine-HYDROcodone ([place]) 10-8 MG/5ML SUER Completed Course  . predniSONE (DELTASONE) 20 MG tablet Completed Course  . mometasone-formoterol (DULERA) 200-5 MCG/ACT AERO No longer needed (for PRN medications)  . albuterol (PROVENTIL HFA;VENTOLIN HFA) 108 (90 Base) MCG/ACT inhaler No longer needed (for PRN medications)  . allopurinol (ZYLOPRIM) 100 MG tablet Reorder  . losartan (COZAAR) 100 MG tablet Reorder  . Vitamin D, Ergocalciferol, (DRISDOL) 1.25 MG (50000 UT) CAPS capsule Reorder  . montelukast (SINGULAIR) 10 MG tablet Reorder      I provided 23+ minutes of face & non-face-to-face time during this encounter,with over 50% of the time in direct counseling on patients medical  conditions/ medical concerns.  Additional time was spent with charting and coordination of care after the actual visit commenced.   Note:  This note was prepared with assistance of Dragon voice recognition software. Occasional wrong-word or sound-a-like substitutions may have occurred due to the inherent limitations of voice recognition software.  Mellody Dance, DO  This document serves as a record of services personally performed by Mellody Dance, DO. It was created on her behalf by Toni Amend, a trained medical scribe. The creation of this record is based on the scribe's personal observations and the provider's statements to them.   I have reviewed the above medical documentation for accuracy and completeness and I concur.  Mellody Dance, DO 03/01/2019 12:53 PM     Patient Care Team    Relationship Specialty Notifications Start End  Mellody Dance, DO PCP - General Family Medicine  01/03/16   Jacelyn Pi, MD Consulting Physician Endocrinology  08/20/15   Carolan Clines, MD (Inactive) Consulting Physician Urology  05/16/16     -Vitals obtained; medications/ allergies reconciled;  personal medical, social, Sx etc.histories were updated by CMA, reviewed by me and are reflected in chart  Patient Active Problem List   Diagnosis Date Noted  . Prediabetes 05/16/2016    Priority: High  . Essential hypertension 12/13/2012    Priority: High  . Obesity, Class III, BMI 40-49.9 (morbid obesity) ([place]) 12/12/2012    Priority: High  . Asthma 01/03/2016    Priority: Medium  . Hyperprolactinemia ([place]) 08/20/2015    Priority: Medium  . 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 Family history of colon cancer 12/13/2012    Priority: Medium  . Marital dysfunction 12/20/2016    Priority: Low  . Under emotional stress due to marital discourse  12/20/2016    Priority: Low  . Environmental and seasonal allergies 05/16/2016     Priority: Low  . Low testosterone 05/18/2015    Priority: Low  . Environmental allergies 02/27/2019  . Acute gouty arthropathy 10/12/2017  . Vitamin D insufficiency 10/12/2017  . Hypogonadism male 10/12/2017  . Rhinosinusitis 09/27/2017  . Acute right ankle pain 03/28/2017  . Ear popping, right 01/29/2017  . Otitis media with effusion, right 01/29/2017  . Counseling on  health promotion and disease prevention 05/16/2016  . Reactive airway disease that is not asthma 03/22/2016  . Eczema 03/24/2015  . Cough 03/24/2015  . Achilles tendonitis 12/12/2012  . Allergic rhinitis with postnasal drip 08/16/2012     Current Meds  Medication Sig  . allopurinol (ZYLOPRIM) 100 MG tablet TAKE 1 TABLET BY MOUTH ONCE DAILY  . cabergoline (DOSTINEX) 0.5 MG tablet Take 1 tablet by mouth 2 (two) times a week.  . clobetasol cream (TEMOVATE) 0.05 % Apply topically 2 (two) times daily.  . clomiPHENE (CLOMID) 50 MG tablet Take 1 tablet by mouth daily.  Marland Kitchen losartan (COZAAR) 50 MG tablet 1 po DAILY.  . metFORMIN (GLUCOPHAGE-XR) 500 MG 24 hr tablet Take 1 tablet by mouth daily with supper.  . montelukast (SINGULAIR) 10 MG tablet TAKE 1 TABLET BY MOUTH AT BEDTIME.  Marland Kitchen Vitamin D, Ergocalciferol, (DRISDOL) 1.25 MG (50000 UT) CAPS capsule TAKE 1 CAPSULE BY MOUTH EVERY 7 DAYS.  . [DISCONTINUED] allopurinol (ZYLOPRIM) 100 MG tablet TAKE 1 TABLET BY MOUTH ONCE DAILY FOR 1 WEEK, THEN INCREASE TO 1 TABLET TWICE DAILY. (Patient taking differently: TAKE 1 TABLET BY MOUTH ONCE DAILY)  . [DISCONTINUED] losartan (COZAAR) 100 MG tablet TAKE 1/2 TABLET (50 MG TOTAL) BY MOUTH DAILY.  . [DISCONTINUED] montelukast (SINGULAIR) 10 MG tablet TAKE 1 TABLET BY MOUTH AT BEDTIME.*NEEDS OFFICE VISIT FOR REFILLS*  . [DISCONTINUED] Vitamin D, Ergocalciferol, (DRISDOL) 1.25 MG (50000 UT) CAPS capsule TAKE 1 CAPSULE BY MOUTH EVERY 7 DAYS.     Allergies  Allergen Reactions  . Bee Venom      ROS:  See above HPI for pertinent positives  and negatives   Objective:   Blood pressure 126/74, pulse 83, temperature (!) 97.4 F (36.3 C), height 6' (1.829 m), weight 297 lb 6.4 oz (134.9 kg).  (if some vitals are omitted, this means that patient was UNABLE to obtain them even though they were asked to get them prior to OV today.  They were asked to call us at their earliest convenience with these once obtained.)  General: A & O * 3; visually in no acute distress; in usual state of health.  Skin: Visible skin appears normal and pt's usual skin color HEENT:  EOMI, head is normocephalic and atraumatic.  Sclera are anicteric. Neck has a good range of motion.  Lips are noncyanotic Chest: normal chest excursion and movement Respiratory: speaking in full sentences, no conversational dyspnea; no use of accessory muscles Psych: insight good, mood- appears full

## 2019-03-29 DIAGNOSIS — Z630 Problems in relationship with spouse or partner: Secondary | ICD-10-CM | POA: Insufficient documentation

## 2019-06-09 DIAGNOSIS — R7301 Impaired fasting glucose: Secondary | ICD-10-CM | POA: Diagnosis not present

## 2019-06-09 DIAGNOSIS — D443 Neoplasm of uncertain behavior of pituitary gland: Secondary | ICD-10-CM | POA: Diagnosis not present

## 2019-06-09 DIAGNOSIS — E291 Testicular hypofunction: Secondary | ICD-10-CM | POA: Diagnosis not present

## 2019-06-09 DIAGNOSIS — E221 Hyperprolactinemia: Secondary | ICD-10-CM | POA: Diagnosis not present

## 2019-06-10 DIAGNOSIS — E291 Testicular hypofunction: Secondary | ICD-10-CM | POA: Diagnosis not present

## 2019-06-11 DIAGNOSIS — E291 Testicular hypofunction: Secondary | ICD-10-CM | POA: Diagnosis not present

## 2019-06-16 DIAGNOSIS — E221 Hyperprolactinemia: Secondary | ICD-10-CM | POA: Diagnosis not present

## 2019-06-16 DIAGNOSIS — I10 Essential (primary) hypertension: Secondary | ICD-10-CM | POA: Diagnosis not present

## 2019-06-16 DIAGNOSIS — E291 Testicular hypofunction: Secondary | ICD-10-CM | POA: Diagnosis not present

## 2019-06-16 DIAGNOSIS — D443 Neoplasm of uncertain behavior of pituitary gland: Secondary | ICD-10-CM | POA: Diagnosis not present

## 2019-07-07 ENCOUNTER — Other Ambulatory Visit: Payer: Self-pay | Admitting: Endocrinology

## 2019-07-07 DIAGNOSIS — D443 Neoplasm of uncertain behavior of pituitary gland: Secondary | ICD-10-CM

## 2019-07-17 ENCOUNTER — Ambulatory Visit
Admission: RE | Admit: 2019-07-17 | Discharge: 2019-07-17 | Disposition: A | Discharge status: Home / Self Care | Payer: BC Managed Care – PPO | Source: Ambulatory Visit | Attending: Endocrinology | Admitting: Endocrinology

## 2019-07-17 ENCOUNTER — Other Ambulatory Visit: Payer: Self-pay

## 2019-07-17 DIAGNOSIS — D443 Neoplasm of uncertain behavior of pituitary gland: Secondary | ICD-10-CM

## 2019-07-17 MED ORDER — GADOBENATE DIMEGLUMINE 529 MG/ML IV SOLN
10.0000 mL | Freq: Once | INTRAVENOUS | Status: AC | PRN
  Administered 2019-07-17: 10 mL via INTRAVENOUS

## 2019-08-20 DIAGNOSIS — E221 Hyperprolactinemia: Secondary | ICD-10-CM | POA: Diagnosis not present

## 2019-08-20 DIAGNOSIS — E291 Testicular hypofunction: Secondary | ICD-10-CM | POA: Diagnosis not present

## 2019-09-08 ENCOUNTER — Other Ambulatory Visit: Payer: Self-pay | Admitting: Family Medicine

## 2019-09-08 DIAGNOSIS — I10 Essential (primary) hypertension: Secondary | ICD-10-CM

## 2019-09-11 DIAGNOSIS — E291 Testicular hypofunction: Secondary | ICD-10-CM | POA: Diagnosis not present

## 2019-09-11 DIAGNOSIS — D352 Benign neoplasm of pituitary gland: Secondary | ICD-10-CM | POA: Diagnosis not present

## 2019-10-08 ENCOUNTER — Other Ambulatory Visit: Payer: Self-pay | Admitting: Family Medicine

## 2019-10-08 DIAGNOSIS — I10 Essential (primary) hypertension: Secondary | ICD-10-CM

## 2019-10-08 DIAGNOSIS — R0989 Other specified symptoms and signs involving the circulatory and respiratory systems: Secondary | ICD-10-CM

## 2019-10-08 DIAGNOSIS — J989 Respiratory disorder, unspecified: Secondary | ICD-10-CM

## 2019-10-08 DIAGNOSIS — Z9109 Other allergy status, other than to drugs and biological substances: Secondary | ICD-10-CM

## 2019-10-09 ENCOUNTER — Telehealth: Payer: Self-pay

## 2019-10-09 NOTE — Telephone Encounter (Signed)
Please call pt to schedule appt.  No further refills until pt is seen.  T. Majesty Oehlert, CMA  

## 2019-10-23 DIAGNOSIS — E291 Testicular hypofunction: Secondary | ICD-10-CM | POA: Diagnosis not present

## 2019-11-06 ENCOUNTER — Other Ambulatory Visit: Payer: Self-pay | Admitting: Family Medicine

## 2019-11-06 DIAGNOSIS — J989 Respiratory disorder, unspecified: Secondary | ICD-10-CM

## 2019-11-06 DIAGNOSIS — R0989 Other specified symptoms and signs involving the circulatory and respiratory systems: Secondary | ICD-10-CM

## 2019-11-06 DIAGNOSIS — Z9109 Other allergy status, other than to drugs and biological substances: Secondary | ICD-10-CM

## 2019-11-17 ENCOUNTER — Telehealth: Payer: Self-pay

## 2019-11-17 NOTE — Telephone Encounter (Signed)
Opened in error. T. Nelson, CMA 
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
## 2019-11-21 ENCOUNTER — Other Ambulatory Visit: Payer: Self-pay

## 2019-11-21 ENCOUNTER — Encounter: Payer: Self-pay | Admitting: Physician Assistant

## 2019-11-21 ENCOUNTER — Ambulatory Visit: Payer: BC Managed Care – PPO | Admitting: Physician Assistant

## 2019-11-21 VITALS — BP 109/69 | HR 80 | Temp 98.1°F | Ht 72.0 in | Wt 289.4 lb

## 2019-11-21 DIAGNOSIS — J309 Allergic rhinitis, unspecified: Secondary | ICD-10-CM

## 2019-11-21 DIAGNOSIS — Z800 Family history of malignant neoplasm of digestive organs: Secondary | ICD-10-CM

## 2019-11-21 DIAGNOSIS — I10 Essential (primary) hypertension: Secondary | ICD-10-CM | POA: Diagnosis not present

## 2019-11-21 DIAGNOSIS — R0989 Other specified symptoms and signs involving the circulatory and respiratory systems: Secondary | ICD-10-CM | POA: Diagnosis not present

## 2019-11-21 DIAGNOSIS — Z1211 Encounter for screening for malignant neoplasm of colon: Secondary | ICD-10-CM

## 2019-11-21 DIAGNOSIS — J3089 Other allergic rhinitis: Secondary | ICD-10-CM | POA: Diagnosis not present

## 2019-11-21 DIAGNOSIS — R0982 Postnasal drip: Secondary | ICD-10-CM

## 2019-11-21 DIAGNOSIS — J989 Respiratory disorder, unspecified: Secondary | ICD-10-CM

## 2019-11-21 MED ORDER — MONTELUKAST SODIUM 10 MG PO TABS: 10.0000 mg | ORAL_TABLET | Freq: Every day | ORAL | 1 refills | Status: DC

## 2019-11-21 MED ORDER — LOSARTAN POTASSIUM 50 MG PO TABS: 50.0000 mg | ORAL_TABLET | Freq: Every day | ORAL | 1 refills | Status: DC

## 2019-11-21 MED ORDER — FLUTICASONE PROPIONATE 50 MCG/ACT NA SUSP: NASAL | 1 refills | Status: DC

## 2019-11-21 NOTE — Patient Instructions (Signed)

## 2019-11-22 LAB — COMPREHENSIVE METABOLIC PANEL
ALT: 15 IU/L (ref 0–44)
AST: 16 IU/L (ref 0–40)
Albumin/Globulin Ratio: 1.6 (ref 1.2–2.2)
Albumin: 4.3 g/dL (ref 4.0–5.0)
Alkaline Phosphatase: 83 IU/L (ref 39–117)
BUN/Creatinine Ratio: 14 (ref 9–20)
BUN: 14 mg/dL (ref 6–24)
Bilirubin Total: 0.3 mg/dL (ref 0.0–1.2)
CO2: 25 mmol/L (ref 20–29)
Calcium: 9.4 mg/dL (ref 8.7–10.2)
Chloride: 102 mmol/L (ref 96–106)
Creatinine, Ser: 0.98 mg/dL (ref 0.76–1.27)
GFR calc Af Amer: 104 mL/min/{1.73_m2} (ref >59)
GFR calc non Af Amer: 90 mL/min/{1.73_m2} (ref >59)
Globulin, Total: 2.7 g/dL (ref 1.5–4.5)
Glucose: 102 mg/dL — ABNORMAL HIGH (ref 65–99)
Potassium: 4.1 mmol/L (ref 3.5–5.2)
Sodium: 140 mmol/L (ref 134–144)
Total Protein: 7 g/dL (ref 6.0–8.5)

## 2019-12-10 ENCOUNTER — Telehealth: Payer: Self-pay | Admitting: Physician Assistant

## 2019-12-10 DIAGNOSIS — L255 Unspecified contact dermatitis due to plants, except food: Secondary | ICD-10-CM | POA: Diagnosis not present

## 2019-12-10 DIAGNOSIS — R21 Rash and other nonspecific skin eruption: Secondary | ICD-10-CM | POA: Diagnosis not present

## 2019-12-10 NOTE — Telephone Encounter (Signed)
Patient advised of the below and verbalized understanding. Patient aware we have no available apts at this time and was instructed to seek treatment at [facility]. Provided patient with number to [facility] on [address]. AS, CMA

## 2019-12-10 NOTE — Telephone Encounter (Signed)
I would recommend for him to seek an in-person evaluation for visualization of rash to get the appropriate treatment. There are many different types of rashes and it is difficult to treat without seeing the appearance and pattern. If the rash is on multiple body parts then most likely he would need an oral medication vs cream.   Thank you, Herb Grays

## 2019-12-10 NOTE — Telephone Encounter (Signed)
Patient called states he has had a rash now for several days & was seeking appt w/ provider (Advised none available today )   ---- Pt wonders if PA can call in Prednisone      Rx for him or if nurse has some other suggestion he can do to ease his symptom.   --Forwarding note to med asst to contact pt @ [phone].  --glh

## 2019-12-10 NOTE — Telephone Encounter (Signed)
Patient states that he has little red bumps mainly on his torso but some on arms and legs. States they are very itchy. Patient took Benadryl last night that helped some.   Requesting medication to be called in. Please advise. AS, CMA

## 2020-01-21 DIAGNOSIS — M25561 Pain in right knee: Secondary | ICD-10-CM | POA: Diagnosis not present

## 2020-01-21 DIAGNOSIS — M25571 Pain in right ankle and joints of right foot: Secondary | ICD-10-CM | POA: Diagnosis not present

## 2020-02-05 DIAGNOSIS — M25571 Pain in right ankle and joints of right foot: Secondary | ICD-10-CM | POA: Diagnosis not present

## 2020-02-05 DIAGNOSIS — M7661 Achilles tendinitis, right leg: Secondary | ICD-10-CM | POA: Diagnosis not present

## 2020-02-05 DIAGNOSIS — M79671 Pain in right foot: Secondary | ICD-10-CM | POA: Diagnosis not present

## 2020-02-16 DIAGNOSIS — M25571 Pain in right ankle and joints of right foot: Secondary | ICD-10-CM | POA: Diagnosis not present

## 2020-02-18 ENCOUNTER — Encounter: Payer: Self-pay | Admitting: Internal Medicine

## 2020-02-23 DIAGNOSIS — S86011A Strain of right Achilles tendon, initial encounter: Secondary | ICD-10-CM | POA: Diagnosis not present

## 2020-02-23 DIAGNOSIS — M79671 Pain in right foot: Secondary | ICD-10-CM | POA: Diagnosis not present

## 2020-02-24 ENCOUNTER — Other Ambulatory Visit: Payer: Self-pay | Admitting: Physician Assistant

## 2020-03-01 DIAGNOSIS — M7661 Achilles tendinitis, right leg: Secondary | ICD-10-CM | POA: Diagnosis not present

## 2020-03-30 DIAGNOSIS — M7661 Achilles tendinitis, right leg: Secondary | ICD-10-CM | POA: Diagnosis not present

## 2020-03-31 ENCOUNTER — Other Ambulatory Visit: Payer: BC Managed Care – PPO

## 2020-03-31 ENCOUNTER — Other Ambulatory Visit: Payer: Self-pay

## 2020-03-31 DIAGNOSIS — R7303 Prediabetes: Secondary | ICD-10-CM

## 2020-03-31 DIAGNOSIS — Z0000 Encounter for general adult medical examination without abnormal findings: Secondary | ICD-10-CM

## 2020-03-31 DIAGNOSIS — I10 Essential (primary) hypertension: Secondary | ICD-10-CM | POA: Diagnosis not present

## 2020-03-31 DIAGNOSIS — E221 Hyperprolactinemia: Secondary | ICD-10-CM | POA: Diagnosis not present

## 2020-04-01 LAB — COMPREHENSIVE METABOLIC PANEL
ALT: 14 IU/L (ref 0–44)
AST: 15 IU/L (ref 0–40)
Albumin/Globulin Ratio: 1.8 (ref 1.2–2.2)
Albumin: 4.3 g/dL (ref 4.0–5.0)
Alkaline Phosphatase: 76 IU/L (ref 48–121)
BUN/Creatinine Ratio: 19 (ref 9–20)
BUN: 14 mg/dL (ref 6–24)
Bilirubin Total: 0.4 mg/dL (ref 0.0–1.2)
CO2: 26 mmol/L (ref 20–29)
Calcium: 9.3 mg/dL (ref 8.7–10.2)
Chloride: 102 mmol/L (ref 96–106)
Creatinine, Ser: 0.75 mg/dL — ABNORMAL LOW (ref 0.76–1.27)
GFR calc Af Amer: 125 mL/min/{1.73_m2} (ref >59)
GFR calc non Af Amer: 108 mL/min/{1.73_m2} (ref >59)
Globulin, Total: 2.4 g/dL (ref 1.5–4.5)
Glucose: 82 mg/dL (ref 65–99)
Potassium: 4.6 mmol/L (ref 3.5–5.2)
Sodium: 139 mmol/L (ref 134–144)
Total Protein: 6.7 g/dL (ref 6.0–8.5)

## 2020-04-01 LAB — LIPID PANEL
Chol/HDL Ratio: 3.5 ratio (ref 0.0–5.0)
Cholesterol, Total: 188 mg/dL (ref 100–199)
HDL: 54 mg/dL (ref >39)
LDL Chol Calc (NIH): 116 mg/dL — ABNORMAL HIGH (ref 0–99)
Triglycerides: 101 mg/dL (ref 0–149)
VLDL Cholesterol Cal: 18 mg/dL (ref 5–40)

## 2020-04-01 LAB — CBC
Hematocrit: 40.3 % (ref 37.5–51.0)
Hemoglobin: 13.3 g/dL (ref 13.0–17.7)
MCH: 28.8 pg (ref 26.6–33.0)
MCHC: 33 g/dL (ref 31.5–35.7)
MCV: 87 fL (ref 79–97)
Platelets: 265 10*3/uL (ref 150–450)
RBC: 4.62 x10E6/uL (ref 4.14–5.80)
RDW: 12.3 % (ref 11.6–15.4)
WBC: 7.4 10*3/uL (ref 3.4–10.8)

## 2020-04-01 LAB — HEMOGLOBIN A1C
Est. average glucose Bld gHb Est-mCnc: 105 mg/dL
Hgb A1c MFr Bld: 5.3 % (ref 4.8–5.6)

## 2020-04-01 LAB — TSH: TSH: 1.66 u[IU]/mL (ref 0.450–4.500)

## 2020-04-02 ENCOUNTER — Other Ambulatory Visit: Payer: BC Managed Care – PPO

## 2020-04-02 DIAGNOSIS — M7661 Achilles tendinitis, right leg: Secondary | ICD-10-CM | POA: Diagnosis not present

## 2020-04-09 ENCOUNTER — Encounter: Payer: BC Managed Care – PPO | Admitting: Physician Assistant

## 2020-04-13 ENCOUNTER — Encounter: Payer: Self-pay | Admitting: Internal Medicine

## 2020-04-13 ENCOUNTER — Ambulatory Visit (AMBULATORY_SURGERY_CENTER): Payer: Self-pay

## 2020-04-13 ENCOUNTER — Other Ambulatory Visit: Payer: Self-pay

## 2020-04-13 VITALS — Ht 72.0 in | Wt 291.0 lb

## 2020-04-13 DIAGNOSIS — Z800 Family history of malignant neoplasm of digestive organs: Secondary | ICD-10-CM

## 2020-04-13 DIAGNOSIS — Z1211 Encounter for screening for malignant neoplasm of colon: Secondary | ICD-10-CM

## 2020-04-13 MED ORDER — SUTAB 1479-225-188 MG PO TABS: 1.0000 | ORAL_TABLET | ORAL | 0 refills | Status: DC

## 2020-04-13 NOTE — Progress Notes (Signed)
No egg or soy allergy known to patient  No issues with past sedation with any surgeries or procedures--- NO past surgeries No intubation problems in the past  No FH of Malignant Hyperthermia No diet pills per patient No home 02 use per patient  No blood thinners per patient  Pt denies issues with constipation  No A fib or A flutter  EMMI video via [place] 19 guidelines implemented in PV today with Pt and RN  Coupon given to pt in PV today , Code to Pharmacy  COVID vaccines completed on 10/2019 per pt;  Due to the COVID-19 pandemic we are asking patients to follow these guidelines. Please only bring one care partner. Please be aware that your care partner may wait in the car in the parking lot or if they feel like they will be too hot to wait in the car, they may wait in the lobby on the 4th floor. All care partners are required to wear a mask the entire time (we do not have any that we can provide them), they need to practice social distancing, and we will do a Covid check for all patient's and care partners when you arrive. Also we will check their temperature and your temperature. If the care partner waits in their car they need to stay in the parking lot the entire time and we will call them on their cell phone when the patient is ready for discharge so they can bring the car to the front of the building. Also all patient's will need to wear a mask into building.

## 2020-04-14 DIAGNOSIS — M7661 Achilles tendinitis, right leg: Secondary | ICD-10-CM | POA: Diagnosis not present

## 2020-04-20 DIAGNOSIS — M7661 Achilles tendinitis, right leg: Secondary | ICD-10-CM | POA: Diagnosis not present

## 2020-04-21 ENCOUNTER — Other Ambulatory Visit: Payer: Self-pay | Admitting: Physician Assistant

## 2020-04-22 DIAGNOSIS — M7661 Achilles tendinitis, right leg: Secondary | ICD-10-CM | POA: Diagnosis not present

## 2020-04-23 IMAGING — MR MR HEAD WO/W CM
19 series · 48 of 48 positions shown · IV contrast (multihance)
Comparison: No pertinent prior studies available for comparison.

CLINICAL DATA: Neoplasm of uncertain behavior of pituitary gland.
Additional history provided: Patient reportedly diagnosed with the
pituitary neoplasm 5 years ago, history of prolactinemia.

Creatinine was obtained on site at [HOSPITAL] at [HOSPITAL].
Results: Creatinine 0.8 mg/dL (GFR 105).
EXAM:
MRI HEAD WITHOUT AND WITH CONTRAST
TECHNIQUE: Multiplanar, multiecho pulse sequences of the brain and surrounding
structures were obtained without and with intravenous contrast.
CONTRAST:  10mL MULTIHANCE GADOBENATE DIMEGLUMINE 529 MG/ML IV SOLN

[Series 5: T1 · sagittal · 4.0mm · 0.75mm/px · 1 of 31 slices shown (1 of 5)]
[im 1/31]
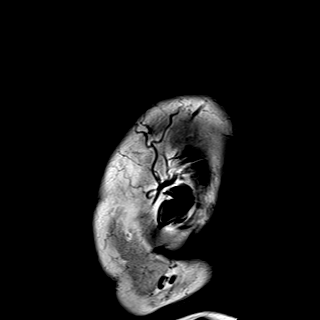

[Series 6: DWI · axial · 3.0mm · 1.50mm/px · z∈[-88,+75]mm · 6 of 86 slices shown (1 of 2)]
[im 1/86]
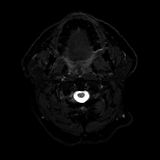
[im 18/86]
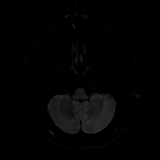
[im 35/86]
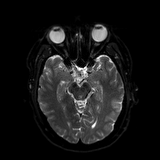
[im 52/86]
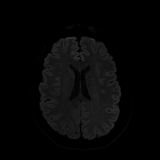
[im 69/86]
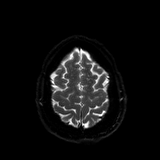
[im 86/86]
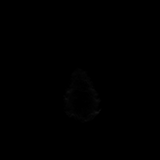

[Series 7: DWI · axial · 3.0mm · 1.50mm/px · z∈[-88,+75]mm · 3 of 43 slices shown (2 of 2)]
[im 1/43]
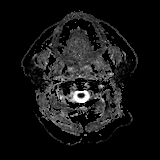
[im 22/43]
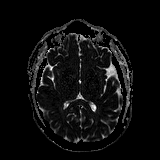
[im 43/43]
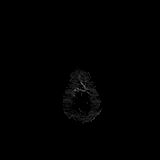

[Series 8: T2 · axial · 4.0mm · 0.38mm/px · z∈[-88,+78]mm · 2 of 33 slices shown]
[im 1/33]
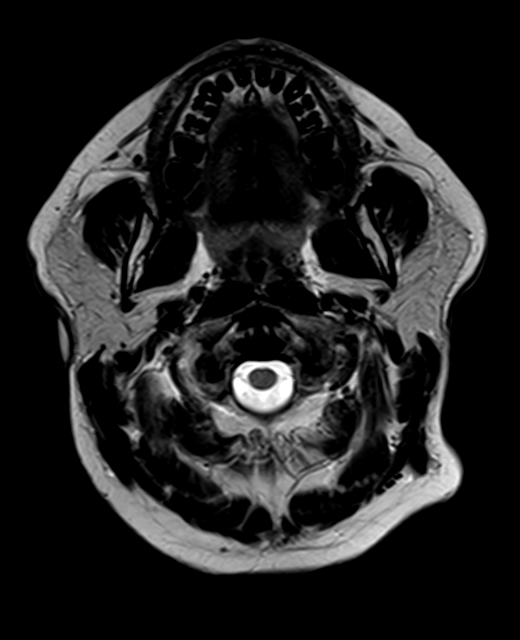
[im 33/33]
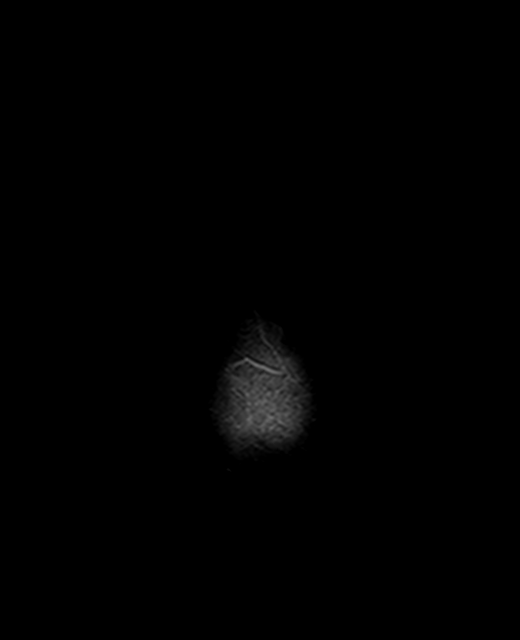

[Series 10: swi_images · axial · 1.5mm · 0.94mm/px · z∈[-82,+72]mm · 8 of 103 slices shown]
[im 1/103]
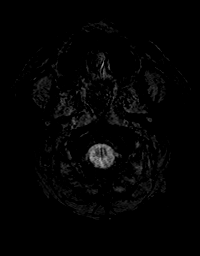
[im 15/103]
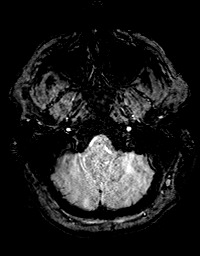
[im 30/103]
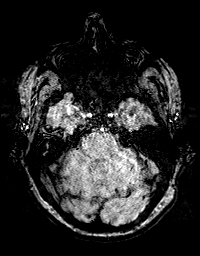
[im 44/103]
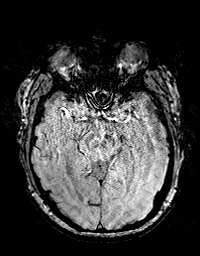
[im 59/103]
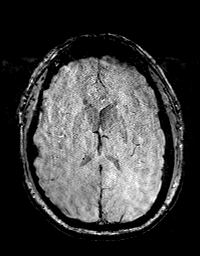
[im 73/103]
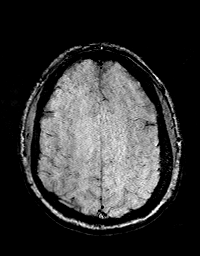
[im 88/103]
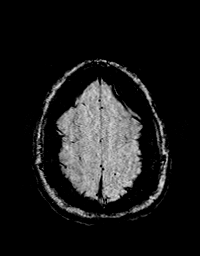
[im 103/103]
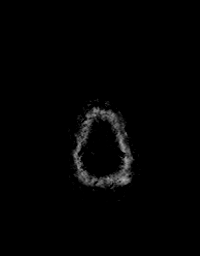

[Series 11: FLAIR · axial · 3.0mm · 0.75mm/px · z∈[-89,+79]mm · 2 of 29 slices shown]
[im 1/29]
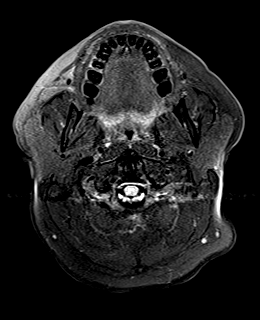
[im 29/29]
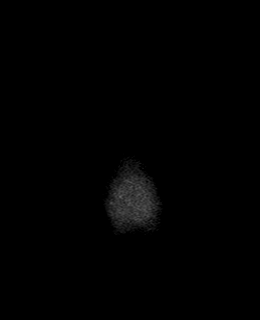

[Series 12: T1 · sagittal · 3.0mm · 0.42mm/px · 1 of 15 slices shown (2 of 5)]
[im 1/15]
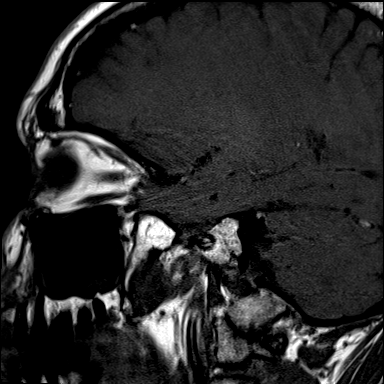

[Series 13: T1 · coronal · 3.0mm · 0.42mm/px · 1 of 13 slices shown (3 of 5)]
[im 1/13]
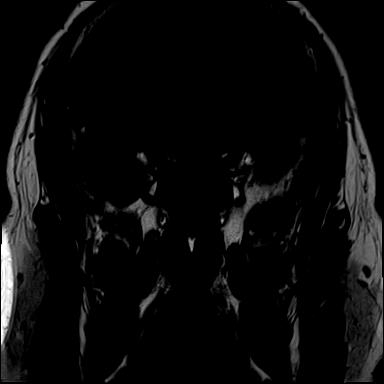

[Series 14: T1 · coronal · non-contrast · 3.0mm · 0.62mm/px · 1 of 11 slices shown (4 of 5)]
[im 1/11]
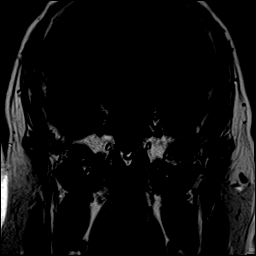

[Series 15: cor post dyn · coronal · 3.0mm · 0.62mm/px · 1 of 11 slices shown (1 of 6)]
[im 1/11]
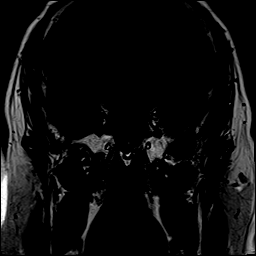

[Series 16: cor post dyn · coronal · 3.0mm · 0.62mm/px · 1 of 11 slices shown (2 of 6)]
[im 1/11]
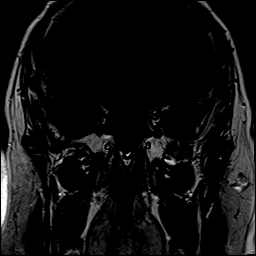

[Series 17: cor post dyn · coronal · 3.0mm · 0.62mm/px · 1 of 11 slices shown (3 of 6)]
[im 1/11]
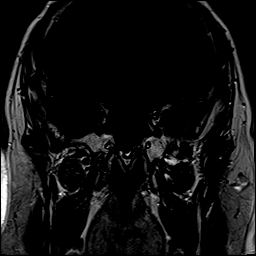

[Series 18: cor post dyn · coronal · 3.0mm · 0.62mm/px · 1 of 11 slices shown (4 of 6)]
[im 1/11]
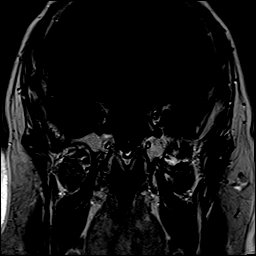

[Series 19: cor post dyn · coronal · 3.0mm · 0.62mm/px · 1 of 11 slices shown (5 of 6)]
[im 1/11]
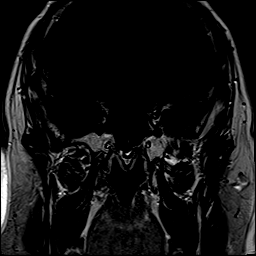

[Series 20: cor post dyn · coronal · 3.0mm · 0.62mm/px · 1 of 11 slices shown (6 of 6)]
[im 1/11]
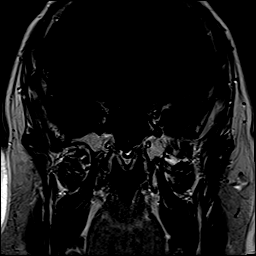

[Series 21: T1 post-contrast · sagittal · 3.0mm · 0.42mm/px · 1 of 15 slices shown (1 of 3)]
[im 1/15]
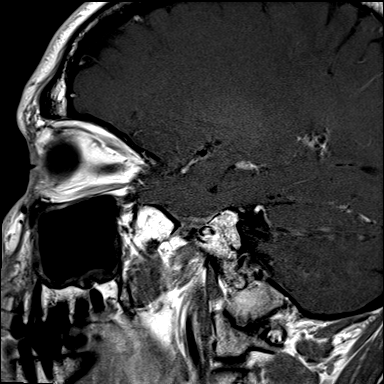

[Series 22: T1 post-contrast · coronal · 3.0mm · 0.42mm/px · 1 of 13 slices shown (2 of 3)]
[im 1/13]
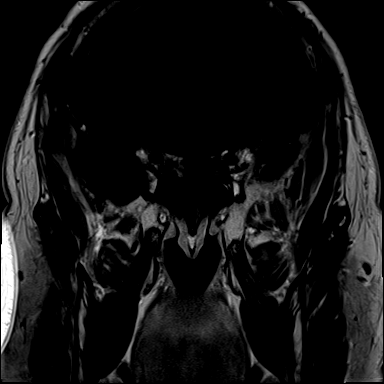

[Series 23: T1 · axial · 1.0mm · 0.94mm/px · z∈[-84,+75]mm · 12 of 160 slices shown (5 of 5)]
[im 1/160]
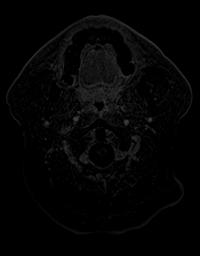
[im 15/160]
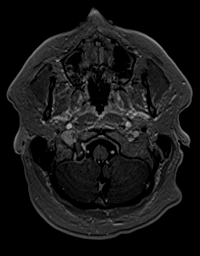
[im 29/160]
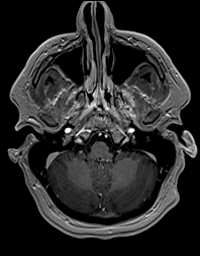
[im 44/160]
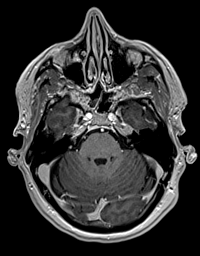
[im 58/160]
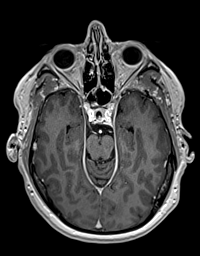
[im 73/160]
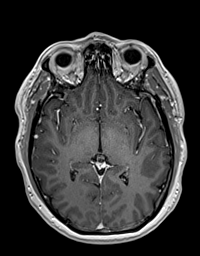
[im 87/160]
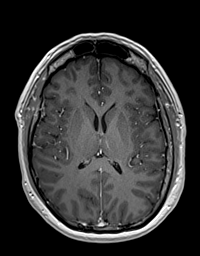
[im 102/160]
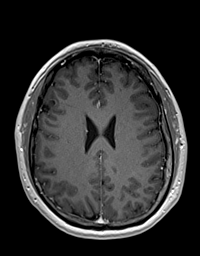
[im 116/160]
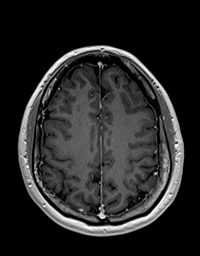
[im 131/160]
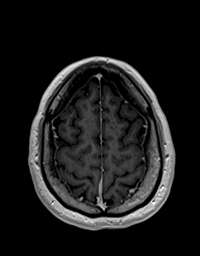
[im 145/160]
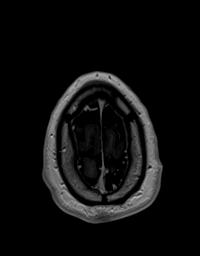
[im 160/160]
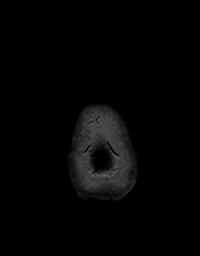

[Series 24: T1 post-contrast · coronal · 4.0mm · 0.47mm/px · 3 of 37 slices shown (3 of 3)]
[im 1/37]
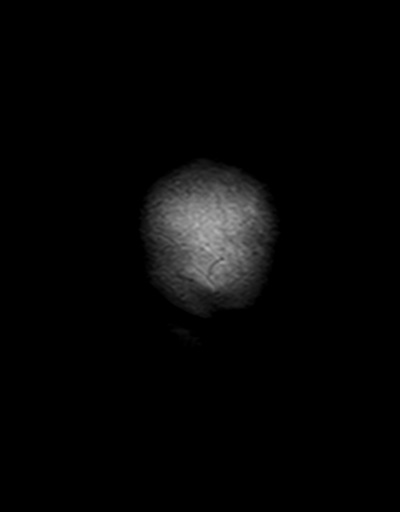
[im 19/37]
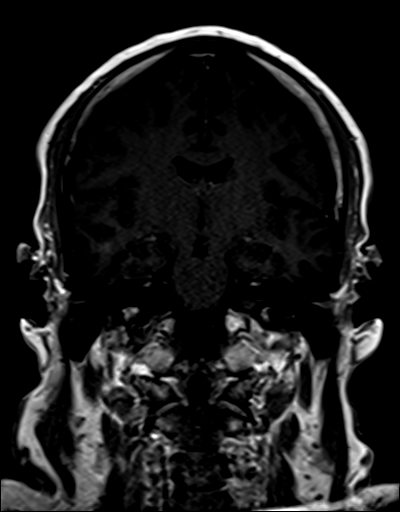
[im 37/37]
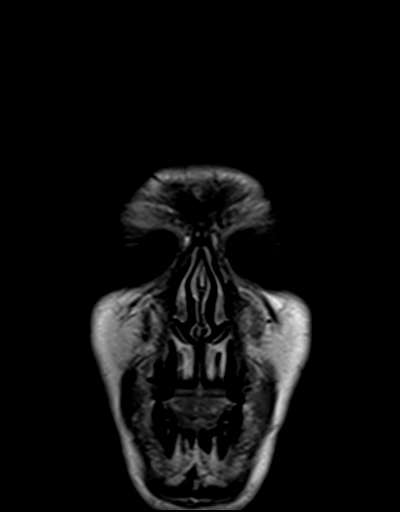

[48 of 48 positions shown; findings below may reference images not displayed]

FINDINGS: Brain:

The pituitary gland is prominent in size measuring 14 x 11 x 11 mm
(AP x TV x CC) with heterogeneous enhancement. However, no focal
pituitary lesion is identified. There is no suprasellar extension.
The pituitary stalk is midline.

There is no evidence of acute infarct.

No midline shift or extra-axial fluid collection.

No chronic intracranial blood products.

No focal parenchymal signal abnormality. No abnormal enhancement
elsewhere within the brain.

Cerebral volume is normal for age.

Vascular: Flow voids maintained within the proximal large arterial
vessels. Expected enhancement within the dural venous sinuses.

Skull and upper cervical spine: No focal marrow lesion.

Sinuses/Orbits: Visualized orbits demonstrate no acute abnormality.
Mild scattered paranasal sinus mucosal thickening. No significant
mastoid effusion
IMPRESSION: Nonspecific prominence and heterogeneous enhancement of the
pituitary gland without focal pituitary lesion identified.

Otherwise normal MRI appearance of the brain for age. No evidence of
acute intracranial abnormality.

## 2020-04-27 ENCOUNTER — Other Ambulatory Visit: Payer: Self-pay

## 2020-04-27 ENCOUNTER — Ambulatory Visit (AMBULATORY_SURGERY_CENTER): Payer: BC Managed Care – PPO | Admitting: Internal Medicine

## 2020-04-27 ENCOUNTER — Encounter: Payer: Self-pay | Admitting: Internal Medicine

## 2020-04-27 VITALS — BP 143/93 | HR 70 | Temp 97.3°F | Resp 11 | Ht 72.0 in | Wt 291.0 lb

## 2020-04-27 DIAGNOSIS — Z800 Family history of malignant neoplasm of digestive organs: Secondary | ICD-10-CM | POA: Diagnosis not present

## 2020-04-27 DIAGNOSIS — Z1211 Encounter for screening for malignant neoplasm of colon: Secondary | ICD-10-CM

## 2020-04-27 MED ORDER — SODIUM CHLORIDE 0.9 % IV SOLN: 500.0000 mL | Freq: Once | INTRAVENOUS | Status: DC

## 2020-04-27 NOTE — Op Note (Signed)
[facility] Patient Name: David Aguilar Procedure Date: 04/27/2020 7:32 AM MRN: [id number] Endoscopist: Docia Chuck. Henrene Pastor , MD Age: 49 Referring MD:  Date of Birth: 01-04-1971 Gender: Male Account #: [id number] Procedure:                Colonoscopy Indications:              Screening in patient at increased risk: Colorectal                            cancer in father 42 or older (age 85; stage I) Medicines:                Monitored Anesthesia Care Procedure:                Pre-Anesthesia Assessment:                           - Prior to the procedure, a History and Physical                            was performed, and patient medications and                            allergies were reviewed. The patient's tolerance of                            previous anesthesia was also reviewed. The risks                            and benefits of the procedure and the sedation                            options and risks were discussed with the patient.                            All questions were answered, and informed consent                            was obtained. Prior Anticoagulants: The patient has                            taken no previous anticoagulant or antiplatelet                            agents. ASA Grade Assessment: II - A patient with                            mild systemic disease. After reviewing the risks                            and benefits, the patient was deemed in                            satisfactory condition to undergo the procedure.  After obtaining informed consent, the colonoscope                            was passed under direct vision. Throughout the                            procedure, the patient's blood pressure, pulse, and                            oxygen saturations were monitored continuously. The                            Colonoscope was introduced through the anus and                            advanced to  the the cecum, identified by                            appendiceal orifice and ileocecal valve. The                            ileocecal valve, appendiceal orifice, and rectum                            were photographed. The quality of the bowel                            preparation was excellent. The colonoscopy was                            performed without difficulty. The patient tolerated                            the procedure well. The bowel preparation used was                            SUPREP via split dose instruction. Scope In: 8:26:45 AM Scope Out: 5:39:76 AM Scope Withdrawal Time: 0 hours 8 minutes 12 seconds  Total Procedure Duration: 0 hours 9 minutes 56 seconds  Findings:                 The entire examined colon appeared normal on direct                            and retroflexion views. Complications:            No immediate complications. Estimated blood loss:                            None. Estimated Blood Loss:     Estimated blood loss: none. Impression:               - The entire examined colon is normal on direct and  retroflexion views.                           - No specimens collected. Recommendation:           - Repeat colonoscopy in 10 years for screening                            purposes.                           - Patient has a contact number available for                            emergencies. The signs and symptoms of potential                            delayed complications were discussed with the                            patient. Return to normal activities tomorrow.                            Written discharge instructions were provided to the                            patient.                           - Resume previous diet.                           - Continue present medications. Docia Chuck. Henrene Pastor, MD 04/27/2020 8:44:07 AM This report has been signed electronically.

## 2020-04-27 NOTE — Progress Notes (Signed)
Vital signs checked by:SF  The patient states no changes in medical or surgical history since pre-visit screening on 04/13/20.

## 2020-04-27 NOTE — Progress Notes (Signed)
Report to PACU, RN, vss, BBS= Clear.  

## 2020-04-27 NOTE — Patient Instructions (Signed)
YOU HAD AN ENDOSCOPIC PROCEDURE TODAY AT THE [facility]:   Refer to the procedure report that was given to you for any specific questions about what was found during the examination.  If the procedure report does not answer your questions, please call your gastroenterologist to clarify.  If you requested that your care partner not be given the details of your procedure findings, then the procedure report has been included in a sealed envelope for you to review at your convenience later.  YOU SHOULD EXPECT: Some feelings of bloating in the abdomen. Passage of more gas than usual.  Walking can help get rid of the air that was put into your GI tract during the procedure and reduce the bloating. If you had a lower endoscopy (such as a colonoscopy or flexible sigmoidoscopy) you may notice spotting of blood in your stool or on the toilet paper. If you underwent a bowel prep for your procedure, you may not have a normal bowel movement for a few days.  Please Note:  You might notice some irritation and congestion in your nose or some drainage.  This is from the oxygen used during your procedure.  There is no need for concern and it should clear up in a day or so.  SYMPTOMS TO REPORT IMMEDIATELY:   Following lower endoscopy (colonoscopy or flexible sigmoidoscopy):  Excessive amounts of blood in the stool  Significant tenderness or worsening of abdominal pains  Swelling of the abdomen that is new, acute  Fever of 100F or higher  For urgent or emergent issues, a gastroenterologist can be reached at any hour by calling [phone]. Do not use MyChart messaging for urgent concerns.    DIET:  We do recommend a small meal at first, but then you may proceed to your regular diet.  Drink plenty of fluids but you should avoid alcoholic beverages for 24 hours.  ACTIVITY:  You should plan to take it easy for the rest of today and you should NOT DRIVE or use heavy machinery until tomorrow (because  of the sedation medicines used during the test).    FOLLOW UP: Our staff will call the number listed on your records 48-72 hours following your procedure to check on you and address any questions or concerns that you may have regarding the information given to you following your procedure. If we do not reach you, we will leave a message.  We will attempt to reach you two times.  During this call, we will ask if you have developed any symptoms of COVID 19. If you develop any symptoms (ie: fever, flu-like symptoms, shortness of breath, cough etc.) before then, please call [phone].  If you test positive for Covid 19 in the 2 weeks post procedure, please call and report this information to us.    If any biopsies were taken you will be contacted by phone or by letter within the next 1-3 weeks.  Please call us at [phone] if you have not heard about the biopsies in 3 weeks.    SIGNATURES/CONFIDENTIALITY: You and/or your care partner have signed paperwork which will be entered into your electronic medical record.  These signatures attest to the fact that that the information above on your After Visit Summary has been reviewed and is understood.  Full responsibility of the confidentiality of this discharge information lies with you and/or your care-partner. 

## 2020-04-29 ENCOUNTER — Telehealth: Payer: Self-pay | Admitting: *Deleted

## 2020-04-29 ENCOUNTER — Telehealth: Payer: Self-pay | Admitting: Physician Assistant

## 2020-04-29 DIAGNOSIS — M7661 Achilles tendinitis, right leg: Secondary | ICD-10-CM | POA: Diagnosis not present

## 2020-04-29 NOTE — Telephone Encounter (Signed)
  Follow up Call-  Call back number 04/27/2020  Post procedure Call Back phone  # [phone]  Permission to leave phone message Yes  Some recent data might be hidden     Patient questions:  Do you have a fever, pain , or abdominal swelling? No. Pain Score  0 *  Have you tolerated food without any problems? Yes.    Have you been able to return to your normal activities? Yes.    Do you have any questions about your discharge instructions: Diet   No. Medications  No. Follow up visit  No.  Do you have questions or concerns about your Care? No.  Actions: * If pain score is 4 or above: No action needed, pain <4.  1. Have you developed a fever since your procedure? no  2.   Have you had an respiratory symptoms (SOB or cough) since your procedure? no  3.   Have you tested positive for COVID 19 since your procedure no  4.   Have you had any family members/close contacts diagnosed with the COVID 19 since your procedure?  no   If yes to any of these questions please route to Joylene John, RN and Joella Prince, RN

## 2020-04-29 NOTE — Telephone Encounter (Signed)
Patient is requesting a call back to discuss some lab work questions/concerns. Please contact when available

## 2020-04-29 NOTE — Telephone Encounter (Signed)
Patient wanted to know that if he needs repeat labs in November. Patient aware he does not. AS, CMA

## 2020-04-30 ENCOUNTER — Encounter: Payer: BC Managed Care – PPO | Admitting: Physician Assistant

## 2020-05-19 ENCOUNTER — Other Ambulatory Visit: Payer: Self-pay | Admitting: Physician Assistant

## 2020-05-19 DIAGNOSIS — J3089 Other allergic rhinitis: Secondary | ICD-10-CM

## 2020-05-19 DIAGNOSIS — R0982 Postnasal drip: Secondary | ICD-10-CM

## 2020-05-19 DIAGNOSIS — J309 Allergic rhinitis, unspecified: Secondary | ICD-10-CM

## 2020-05-19 DIAGNOSIS — I10 Essential (primary) hypertension: Secondary | ICD-10-CM

## 2020-05-25 DIAGNOSIS — Z20822 Contact with and (suspected) exposure to covid-19: Secondary | ICD-10-CM | POA: Diagnosis not present

## 2020-06-03 DIAGNOSIS — E291 Testicular hypofunction: Secondary | ICD-10-CM | POA: Diagnosis not present

## 2020-06-07 ENCOUNTER — Encounter: Payer: BC Managed Care – PPO | Admitting: Physician Assistant

## 2020-06-07 DIAGNOSIS — N5201 Erectile dysfunction due to arterial insufficiency: Secondary | ICD-10-CM | POA: Diagnosis not present

## 2020-06-07 DIAGNOSIS — E291 Testicular hypofunction: Secondary | ICD-10-CM | POA: Diagnosis not present

## 2020-06-07 DIAGNOSIS — R7301 Impaired fasting glucose: Secondary | ICD-10-CM | POA: Diagnosis not present

## 2020-06-07 DIAGNOSIS — E221 Hyperprolactinemia: Secondary | ICD-10-CM | POA: Diagnosis not present

## 2020-06-07 DIAGNOSIS — D443 Neoplasm of uncertain behavior of pituitary gland: Secondary | ICD-10-CM | POA: Diagnosis not present

## 2020-06-14 DIAGNOSIS — D443 Neoplasm of uncertain behavior of pituitary gland: Secondary | ICD-10-CM | POA: Diagnosis not present

## 2020-06-14 DIAGNOSIS — E221 Hyperprolactinemia: Secondary | ICD-10-CM | POA: Diagnosis not present

## 2020-06-14 DIAGNOSIS — E291 Testicular hypofunction: Secondary | ICD-10-CM | POA: Diagnosis not present

## 2020-06-14 DIAGNOSIS — I10 Essential (primary) hypertension: Secondary | ICD-10-CM | POA: Diagnosis not present

## 2020-06-14 DIAGNOSIS — Z23 Encounter for immunization: Secondary | ICD-10-CM | POA: Diagnosis not present

## 2020-06-21 ENCOUNTER — Ambulatory Visit: Payer: BC Managed Care – PPO | Admitting: Dermatology

## 2020-06-29 ENCOUNTER — Other Ambulatory Visit: Payer: Self-pay | Admitting: Physician Assistant

## 2020-06-29 DIAGNOSIS — R0982 Postnasal drip: Secondary | ICD-10-CM

## 2020-06-29 DIAGNOSIS — J309 Allergic rhinitis, unspecified: Secondary | ICD-10-CM

## 2020-06-29 DIAGNOSIS — I10 Essential (primary) hypertension: Secondary | ICD-10-CM

## 2020-06-29 DIAGNOSIS — J3089 Other allergic rhinitis: Secondary | ICD-10-CM

## 2020-06-30 ENCOUNTER — Ambulatory Visit (INDEPENDENT_AMBULATORY_CARE_PROVIDER_SITE_OTHER): Payer: BC Managed Care – PPO | Admitting: Allergy and Immunology

## 2020-06-30 ENCOUNTER — Other Ambulatory Visit: Payer: Self-pay

## 2020-06-30 ENCOUNTER — Encounter: Payer: Self-pay | Admitting: Allergy and Immunology

## 2020-06-30 VITALS — BP 124/78 | HR 89 | Resp 16 | Ht 72.0 in | Wt 293.0 lb

## 2020-06-30 DIAGNOSIS — J3089 Other allergic rhinitis: Secondary | ICD-10-CM | POA: Diagnosis not present

## 2020-06-30 DIAGNOSIS — J301 Allergic rhinitis due to pollen: Secondary | ICD-10-CM | POA: Diagnosis not present

## 2020-06-30 DIAGNOSIS — T782XXA Anaphylactic shock, unspecified, initial encounter: Secondary | ICD-10-CM | POA: Diagnosis not present

## 2020-06-30 DIAGNOSIS — J4520 Mild intermittent asthma, uncomplicated: Secondary | ICD-10-CM | POA: Diagnosis not present

## 2020-06-30 DIAGNOSIS — J310 Chronic rhinitis: Secondary | ICD-10-CM

## 2020-06-30 DIAGNOSIS — T63482A Toxic effect of venom of other arthropod, intentional self-harm, initial encounter: Secondary | ICD-10-CM | POA: Diagnosis not present

## 2020-06-30 DIAGNOSIS — T485X5A Adverse effect of other anti-common-cold drugs, initial encounter: Secondary | ICD-10-CM

## 2020-06-30 MED ORDER — AZELASTINE-FLUTICASONE 137-50 MCG/ACT NA SUSP: NASAL | 5 refills | Status: DC

## 2020-06-30 MED ORDER — AUVI-Q 0.3 MG/0.3ML IJ SOAJ: INTRAMUSCULAR | 3 refills | Status: DC

## 2020-06-30 NOTE — Patient Instructions (Addendum)
  1.  Allergen avoidance measures - dust mite, dog, cedar tree  2.  Treat and prevent inflammation:   A.  Montelukast 10 mg - 1 tablet 1 time per day  B.  Dymista - 1 spray each nostril twice a day  C.  Minimize use of OTC Afrin -use alternating single nostril  3.  If needed:   A.  Nasal saline  B.  OTC antihistamine  C.  Auvi-Q 0.3, Benadryl, MD/ER evaluation for allergic reaction  D.  Albuterol HFA -2 inhalations every 4-6 hours  4.  Consider a course of immunotherapy  5.  Blood -hymenoptera IgE panel  6.  Obtain COVID booster  7.  Return to clinic in 4 weeks or earlier if problem
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
## 2020-07-01 ENCOUNTER — Encounter: Payer: Self-pay | Admitting: Allergy and Immunology

## 2020-07-02 ENCOUNTER — Other Ambulatory Visit: Payer: Self-pay | Admitting: *Deleted

## 2020-07-02 MED ORDER — FLUTICASONE PROPIONATE 50 MCG/ACT NA SUSP: NASAL | 5 refills | Status: DC

## 2020-07-02 MED ORDER — AZELASTINE HCL 137 MCG/SPRAY NA SOLN: NASAL | 5 refills | Status: DC

## 2020-07-03 LAB — ALLERGEN HYMENOPTERA PANEL
Bumblebee: <0.1 kU/L
Honeybee IgE: <0.1 kU/L
Hornet, White Face, IgE: 2.23 kU/L — AB
Hornet, Yellow, IgE: 0.81 kU/L — AB
Paper Wasp IgE: 7.92 kU/L — AB
Yellow Jacket, IgE: 1.93 kU/L — AB

## 2020-07-12 ENCOUNTER — Encounter: Payer: BC Managed Care – PPO | Admitting: Physician Assistant

## 2020-07-12 ENCOUNTER — Encounter: Payer: Self-pay | Admitting: Dermatology

## 2020-07-12 ENCOUNTER — Other Ambulatory Visit: Payer: Self-pay

## 2020-07-12 ENCOUNTER — Ambulatory Visit (INDEPENDENT_AMBULATORY_CARE_PROVIDER_SITE_OTHER): Payer: BC Managed Care – PPO | Admitting: Dermatology

## 2020-07-12 DIAGNOSIS — D1800 Hemangioma unspecified site: Secondary | ICD-10-CM | POA: Diagnosis not present

## 2020-07-12 DIAGNOSIS — L918 Other hypertrophic disorders of the skin: Secondary | ICD-10-CM

## 2020-07-12 DIAGNOSIS — C44319 Basal cell carcinoma of skin of other parts of face: Secondary | ICD-10-CM | POA: Diagnosis not present

## 2020-07-12 DIAGNOSIS — Z1283 Encounter for screening for malignant neoplasm of skin: Secondary | ICD-10-CM

## 2020-07-12 DIAGNOSIS — L738 Other specified follicular disorders: Secondary | ICD-10-CM

## 2020-07-12 DIAGNOSIS — L821 Other seborrheic keratosis: Secondary | ICD-10-CM

## 2020-07-12 DIAGNOSIS — D485 Neoplasm of uncertain behavior of skin: Secondary | ICD-10-CM

## 2020-07-13 NOTE — Progress Notes (Signed)
   Follow-Up Visit   Subjective  David Aguilar is a 49 y.o. male who presents for the following: Annual Exam (Left post auricular- per barber).  General skin examination Location:  Duration:  Quality:  Associated Signs/Symptoms: Modifying Factors:  Severity:  Timing: Context:   Objective  Well appearing patient in no apparent distress; mood and affect are within normal limits. Objective  Chest - [facility]): Full body skin examination-no atypical moles or melanoma.  Objective  Left Abdomen (side) - Upper: Very dark red raised papule below umbilicus, dozen smaller red papules torso.  Objective  Right Malar Cheek: 2 mm umbilicated papule which historically is stable.  Objective  Left Popliteal Fossa, Left Upper Back: Tan, textured flat topped 4 mm papules--Discussed benign etiology and prognosis.   Objective  Anterior Mid Neck, Left Anterior Neck, Right Anterior Neck: Pedunculated 2 mm flesh-colored papules  Objective  Right Melolabial Fold: Although patient felt this was a scar, the infiltrative pearly 6 mm papule with dermoscopic corkscrew vessels may be a basal cell carcinoma.  If biopsy confirms, already discussed Mohs surgery.      A full examination was performed including scalp, head, eyes, ears, nose, lips, neck, chest, axillae, abdomen, back, buttocks, bilateral upper extremities, bilateral lower extremities, hands, feet, fingers, toes, fingernails, and toenails. All findings within normal limits unless otherwise noted below.   Assessment & Plan    Encounter for screening for malignant neoplasm of skin Chest - [facility])  Yearly skin check plus patient encouraged to self examine his skin at 30-month intervals.  Hemangioma, unspecified site Left Abdomen (side) - Upper  No need to have removed if stable  Sebaceous gland hyperplasia Right Malar Cheek  No treatment necessary   Seborrheic keratosis (2) Left Popliteal Fossa; Left Upper  Back  No need to remove if stable  Skin tag (3) Left Anterior Neck; Right Anterior Neck; Anterior Mid Neck  Okay to leave if stable  Neoplasm of uncertain behavior of skin Right Melolabial Fold  Skin / nail biopsy Type of biopsy: tangential   Informed consent: discussed and consent obtained   Timeout: patient name, date of birth, surgical site, and procedure verified   Procedure prep:  Patient was prepped and draped in usual sterile fashion (Non sterile) Prep type:  Chlorhexidine Anesthesia: the lesion was anesthetized in a standard fashion   Anesthetic:  1% lidocaine w/ epinephrine 1-100,000 local infiltration Instrument used: flexible razor blade   Outcome: patient tolerated procedure well   Post-procedure details: wound care instructions given    Specimen 1 - Surgical pathology Differential Diagnosis: bcc vs scc  Check Margins: No  If + refer to MOHS     I, Lavonna Monarch, MD, have reviewed all documentation for this visit.  The documentation on 07/13/20 for the exam, diagnosis, procedures, and orders are all accurate and complete.

## 2020-07-19 ENCOUNTER — Telehealth: Payer: Self-pay | Admitting: *Deleted

## 2020-07-19 NOTE — Telephone Encounter (Signed)
Left message for patient to call back  

## 2020-07-19 NOTE — Telephone Encounter (Signed)
-----   Message from Lavonna Monarch, MD sent at 07/16/2020  6:40 PM EST ----- Schedule Mohs

## 2020-07-19 NOTE — Telephone Encounter (Signed)
Path to patient. Made referral to skin surgery center to have spot treated.

## 2020-07-19 NOTE — Telephone Encounter (Signed)
Patient is returning call about pathology results. 

## 2020-07-26 ENCOUNTER — Other Ambulatory Visit: Payer: Self-pay | Admitting: Physician Assistant

## 2020-07-26 DIAGNOSIS — I10 Essential (primary) hypertension: Secondary | ICD-10-CM

## 2020-07-26 DIAGNOSIS — J3089 Other allergic rhinitis: Secondary | ICD-10-CM

## 2020-07-26 DIAGNOSIS — J309 Allergic rhinitis, unspecified: Secondary | ICD-10-CM

## 2020-07-27 ENCOUNTER — Telehealth: Payer: Self-pay | Admitting: Physician Assistant

## 2020-07-27 NOTE — Telephone Encounter (Signed)
Please call patient to schedule OV apt for med refills per last AVS. AS, CMA

## 2020-08-04 ENCOUNTER — Ambulatory Visit: Payer: BC Managed Care – PPO | Admitting: Allergy and Immunology

## 2020-08-13 ENCOUNTER — Other Ambulatory Visit: Payer: Self-pay | Admitting: Physician Assistant

## 2020-08-13 DIAGNOSIS — J309 Allergic rhinitis, unspecified: Secondary | ICD-10-CM

## 2020-08-13 DIAGNOSIS — J3089 Other allergic rhinitis: Secondary | ICD-10-CM

## 2020-08-13 DIAGNOSIS — I10 Essential (primary) hypertension: Secondary | ICD-10-CM

## 2020-08-23 ENCOUNTER — Other Ambulatory Visit: Payer: Self-pay

## 2020-08-23 ENCOUNTER — Encounter: Payer: Self-pay | Admitting: Physician Assistant

## 2020-08-23 ENCOUNTER — Ambulatory Visit: Payer: BC Managed Care – PPO | Admitting: Physician Assistant

## 2020-08-23 VITALS — BP 122/83 | HR 89 | Ht 72.0 in | Wt 297.5 lb

## 2020-08-23 DIAGNOSIS — J3089 Other allergic rhinitis: Secondary | ICD-10-CM

## 2020-08-23 DIAGNOSIS — I10 Essential (primary) hypertension: Secondary | ICD-10-CM

## 2020-08-23 DIAGNOSIS — E559 Vitamin D deficiency, unspecified: Secondary | ICD-10-CM | POA: Diagnosis not present

## 2020-08-23 DIAGNOSIS — E7800 Pure hypercholesterolemia, unspecified: Secondary | ICD-10-CM

## 2020-08-23 DIAGNOSIS — J309 Allergic rhinitis, unspecified: Secondary | ICD-10-CM | POA: Diagnosis not present

## 2020-08-23 DIAGNOSIS — R0982 Postnasal drip: Secondary | ICD-10-CM

## 2020-08-23 DIAGNOSIS — E221 Hyperprolactinemia: Secondary | ICD-10-CM

## 2020-08-23 DIAGNOSIS — J45909 Unspecified asthma, uncomplicated: Secondary | ICD-10-CM

## 2020-08-23 MED ORDER — MONTELUKAST SODIUM 10 MG PO TABS: 10.0000 mg | ORAL_TABLET | Freq: Every day | ORAL | 1 refills | Status: DC

## 2020-08-23 MED ORDER — LOSARTAN POTASSIUM 50 MG PO TABS
50.0000 mg | ORAL_TABLET | Freq: Every day | ORAL | 1 refills | Status: DC
Start: 2020-08-23 — End: 2020-08-23

## 2020-08-23 MED ORDER — LOSARTAN POTASSIUM 50 MG PO TABS: 50.0000 mg | ORAL_TABLET | Freq: Every day | ORAL | 1 refills | Status: DC

## 2020-08-23 NOTE — Assessment & Plan Note (Signed)
-  Will repeat Vitamin D today. Pending results will make medication adjustments if indicated.

## 2020-08-23 NOTE — Patient Instructions (Signed)
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
## 2020-08-23 NOTE — Assessment & Plan Note (Signed)
-  Fairly controlled. -Recommend to monitor BP/pulse at home and if readings consistently >135/85 should notify the clinic for medication adjustments. -Follow a low sodium diet and increase hydration, at least 64 fl oz. -Will continue to monitor and repeat CMP for medication monitoring.

## 2020-08-23 NOTE — Assessment & Plan Note (Signed)
-  Followed by Endocrinology, Dr. Chalmers Cater.

## 2020-08-23 NOTE — Assessment & Plan Note (Addendum)
-  Stable. -Continue current medication regimen. -Will continue to monitor alongside Allergy and Asthma specialty.

## 2020-08-23 NOTE — Assessment & Plan Note (Signed)
-  Stable. -Continue current medication regimen. Provided refill. -Will continue to monitor alongside Allergy and Asthma specialty.

## 2020-08-23 NOTE — Assessment & Plan Note (Signed)
>>  ASSESSMENT AND PLAN FOR REACTIVE AIRWAY DISEASE WRITTEN ON 08/23/2020  8:47 AM BY ABONZA, MARITZA, PA-C  -Stable. -Continue current medication regimen. Provided refill. -Will continue to monitor alongside Allergy and Asthma specialty.

## 2020-08-24 LAB — COMPREHENSIVE METABOLIC PANEL
ALT: 14 IU/L (ref 0–44)
AST: 12 IU/L (ref 0–40)
Albumin/Globulin Ratio: 1.9 (ref 1.2–2.2)
Albumin: 4.4 g/dL (ref 4.0–5.0)
Alkaline Phosphatase: 73 IU/L (ref 44–121)
BUN/Creatinine Ratio: 15 (ref 9–20)
BUN: 15 mg/dL (ref 6–24)
Bilirubin Total: 0.3 mg/dL (ref 0.0–1.2)
CO2: 25 mmol/L (ref 20–29)
Calcium: 9.3 mg/dL (ref 8.7–10.2)
Chloride: 106 mmol/L (ref 96–106)
Creatinine, Ser: 1.01 mg/dL (ref 0.76–1.27)
GFR calc Af Amer: 100 mL/min/{1.73_m2} (ref >59)
GFR calc non Af Amer: 86 mL/min/{1.73_m2} (ref >59)
Globulin, Total: 2.3 g/dL (ref 1.5–4.5)
Glucose: 83 mg/dL (ref 65–99)
Potassium: 4.8 mmol/L (ref 3.5–5.2)
Sodium: 144 mmol/L (ref 134–144)
Total Protein: 6.7 g/dL (ref 6.0–8.5)

## 2020-08-24 LAB — VITAMIN D 25 HYDROXY (VIT D DEFICIENCY, FRACTURES): Vit D, 25-Hydroxy: 36 ng/mL (ref 30.0–100.0)

## 2020-09-01 ENCOUNTER — Telehealth: Payer: Self-pay

## 2020-09-01 NOTE — Telephone Encounter (Signed)
Patient informed of instructions

## 2020-09-01 NOTE — Telephone Encounter (Signed)
Patient called stating he is having frequent nosebleeds. He states he thinks it could be due to the nasal spray. Patient is using Dymista. Patient would like to know what he can do to prevent this from happening so often. Please advice.

## 2020-09-01 NOTE — Telephone Encounter (Signed)
Please inform David Aguilar that he will need to decrease his dose of Dymista.  On his current dose he is developing epistaxis.  He can cut it in half and he may need to only use Dymista 3 times per week if he still continues to have problems with nosebleeds.  Make sure he does not aim Dymista into his septum.

## 2020-10-13 ENCOUNTER — Other Ambulatory Visit: Payer: Self-pay | Admitting: Physician Assistant

## 2020-10-18 DIAGNOSIS — C4401 Basal cell carcinoma of skin of lip: Secondary | ICD-10-CM | POA: Diagnosis not present

## 2020-10-22 ENCOUNTER — Other Ambulatory Visit: Payer: Self-pay | Admitting: Physician Assistant

## 2020-10-22 DIAGNOSIS — M109 Gout, unspecified: Secondary | ICD-10-CM

## 2020-10-25 ENCOUNTER — Other Ambulatory Visit: Payer: Self-pay | Admitting: Family Medicine

## 2020-10-25 DIAGNOSIS — M109 Gout, unspecified: Secondary | ICD-10-CM

## 2020-12-03 DIAGNOSIS — E291 Testicular hypofunction: Secondary | ICD-10-CM | POA: Diagnosis not present

## 2020-12-16 DIAGNOSIS — E291 Testicular hypofunction: Secondary | ICD-10-CM | POA: Diagnosis not present

## 2020-12-16 DIAGNOSIS — D352 Benign neoplasm of pituitary gland: Secondary | ICD-10-CM | POA: Diagnosis not present

## 2021-01-06 ENCOUNTER — Other Ambulatory Visit: Payer: Self-pay | Admitting: Physician Assistant

## 2021-01-06 DIAGNOSIS — M109 Gout, unspecified: Secondary | ICD-10-CM

## 2021-02-08 ENCOUNTER — Other Ambulatory Visit: Payer: Self-pay | Admitting: Physician Assistant

## 2021-03-11 ENCOUNTER — Other Ambulatory Visit: Payer: Self-pay | Admitting: Physician Assistant

## 2021-03-11 DIAGNOSIS — I10 Essential (primary) hypertension: Secondary | ICD-10-CM

## 2021-04-05 ENCOUNTER — Other Ambulatory Visit: Payer: Self-pay | Admitting: Physician Assistant

## 2021-04-05 DIAGNOSIS — I10 Essential (primary) hypertension: Secondary | ICD-10-CM

## 2021-04-26 ENCOUNTER — Other Ambulatory Visit: Payer: Self-pay

## 2021-04-26 ENCOUNTER — Other Ambulatory Visit: Payer: Self-pay | Admitting: Physician Assistant

## 2021-04-26 DIAGNOSIS — I10 Essential (primary) hypertension: Secondary | ICD-10-CM

## 2021-04-26 MED ORDER — LOSARTAN POTASSIUM 50 MG PO TABS: 50.0000 mg | ORAL_TABLET | Freq: Every day | ORAL | 0 refills | Status: DC

## 2021-04-28 ENCOUNTER — Encounter: Payer: Self-pay | Admitting: Physician Assistant

## 2021-04-28 ENCOUNTER — Ambulatory Visit (INDEPENDENT_AMBULATORY_CARE_PROVIDER_SITE_OTHER): Payer: BC Managed Care – PPO | Admitting: Physician Assistant

## 2021-04-28 VITALS — BP 128/79 | HR 82 | Ht 73.0 in | Wt 282.0 lb

## 2021-04-28 DIAGNOSIS — R0982 Postnasal drip: Secondary | ICD-10-CM

## 2021-04-28 DIAGNOSIS — M109 Gout, unspecified: Secondary | ICD-10-CM

## 2021-04-28 DIAGNOSIS — E559 Vitamin D deficiency, unspecified: Secondary | ICD-10-CM | POA: Diagnosis not present

## 2021-04-28 DIAGNOSIS — I10 Essential (primary) hypertension: Secondary | ICD-10-CM | POA: Diagnosis not present

## 2021-04-28 DIAGNOSIS — J3089 Other allergic rhinitis: Secondary | ICD-10-CM | POA: Diagnosis not present

## 2021-04-28 DIAGNOSIS — J309 Allergic rhinitis, unspecified: Secondary | ICD-10-CM

## 2021-04-28 MED ORDER — ALLOPURINOL 100 MG PO TABS: 100.0000 mg | ORAL_TABLET | Freq: Every day | ORAL | 1 refills | Status: DC

## 2021-04-28 MED ORDER — VITAMIN D (ERGOCALCIFEROL) 1.25 MG (50000 UNIT) PO CAPS: 50000.0000 [IU] | ORAL_CAPSULE | ORAL | 0 refills | Status: DC

## 2021-04-28 MED ORDER — MONTELUKAST SODIUM 10 MG PO TABS: 10.0000 mg | ORAL_TABLET | Freq: Every day | ORAL | 1 refills | Status: DC

## 2021-04-28 MED ORDER — LOSARTAN POTASSIUM 50 MG PO TABS: 50.0000 mg | ORAL_TABLET | Freq: Every day | ORAL | 1 refills | Status: DC

## 2021-04-28 NOTE — Progress Notes (Signed)
Telehealth office visit note for David Reid, PA-C- at Primary Care at [facility]   I connected with current patient today by telephone and verified that I am speaking with the correct person    Location of the patient: Home  Location of the provider: Office - This visit type was conducted due to national recommendations for restrictions regarding the COVID-19 Pandemic (e.g. social distancing) in an effort to limit this patient's exposure and mitigate transmission in our community.    - No physical exam could be performed with this format, beyond that communicated to [place] by the patient/ family members as noted.   - Additionally my office staff/ schedulers were to discuss with the patient that there may be a monetary charge related to this service, depending on their medical insurance.  My understanding is that patient understood and consented to proceed.     _________________________________________________________________________________   History of Present Illness: Patient calls in to follow up on hypertension and gout. Patient has no acute concerns today.  HTN: Pt denies chest pain, palpitations, dizziness or lower extremity swelling. Taking medication as directed without side effects. Checks BP at home and readings range <130/80. Pt follows a low salt diet.  Gout: Reports last gout flare-up was few years ago. No recent exacerbation. Taking medication as directed without issues.     GAD 7 : Generalized Anxiety Score 04/28/2021 02/27/2019  Nervous, Anxious, on Edge 1 1  Control/stop worrying 0 1  Worry too much - different things 0 1  Trouble relaxing 0 0  Restless 0 0  Easily annoyed or irritable 0 0  Afraid - awful might happen 0 0  Total GAD 7 Score 1 3  Anxiety Difficulty - Somewhat difficult    Depression screen [facility] 2/9 04/28/2021 08/23/2020 11/21/2019 02/27/2019 07/01/2018  Decreased Interest 0 0 0 0 0  Down, Depressed, Hopeless 0 0 0 0 0  PHQ - 2 Score 0 0 0 0 0   Altered sleeping 0 0 0 0 0  Tired, decreased energy 1 0 0 0 0  Change in appetite 0 0 0 0 0  Feeling bad or failure about yourself  0 0 0 0 0  Trouble concentrating 0 0 0 0 0  Moving slowly or fidgety/restless 0 0 0 0 0  Suicidal thoughts 0 0 0 0 0  PHQ-9 Score 1 0 0 0 0  Difficult doing work/chores Not difficult at all - - - Not difficult at all  Some recent data might be hidden      Impression and Recommendations:     1. Essential hypertension   2. Acute gouty arthropathy- b/l ankles   3. Vitamin D insufficiency   4. Environmental and seasonal allergies   5. Allergic rhinitis with postnasal drip     Essential hypertension: -Stable. -Continue current medication regimen. -Advised to schedule lab visit for FBW including CMP. -Will continue to monitor.  Acute gouty arthropathy: -Stable. -Advised patient if symptoms have been chronically stable we can trial without medication and monitoring symptoms. Recommend to avoid purine-rich foods such as red meat, alcohol, high-fructose corn syrup. Will check uric acid with FBW.  Allergic rhinitis with postnasal drip: -Stable. -Continue current medication regimen. -Will continue to monitor.  Vitamin D insufficiency: -Last Vitamin D 36.0, will repeat vitamin D with lab visit. -On vitamin D 50,000 units once a week. Pending lab results will adjust treatment plan if indicated.  - As part of my medical decision making,  I reviewed the following data within the [place] History obtained from pt /family, CMA notes reviewed and incorporated if applicable, Labs reviewed, Radiograph/ tests reviewed if applicable and OV notes from prior OV's with me, as well as any other specialists she/he has seen since seeing me last, were all reviewed and used in my medical decision making process today.    - Additionally, when appropriate, discussion had with patient regarding our treatment plan, and their biases/concerns about that plan  were used in my medical decision making today.    - The patient agreed with the plan and demonstrated an understanding of the instructions.   No barriers to understanding were identified.     - The patient was advised to call back or seek an in-person evaluation if the symptoms worsen or if the condition fails to improve as anticipated.   Return in about 6 months (around 10/26/2021) for CPE.    No orders of the defined types were placed in this encounter.   Meds ordered this encounter  Medications   allopurinol (ZYLOPRIM) 100 MG tablet    Sig: Take 1 tablet (100 mg total) by mouth daily.    Dispense:  90 tablet    Refill:  1    Order Specific Question:   Supervising Provider    Answer:   Beatrice Lecher D [[id number]]   losartan (COZAAR) 50 MG tablet    Sig: Take 1 tablet (50 mg total) by mouth daily.    Dispense:  90 tablet    Refill:  1    Order Specific Question:   Supervising Provider    Answer:   Beatrice Lecher D [[id number]]   Vitamin D, Ergocalciferol, (DRISDOL) 1.25 MG (50000 UNIT) CAPS capsule    Sig: Take 1 capsule (50,000 Units total) by mouth every 7 (seven) days.    Dispense:  12 capsule    Refill:  0    Order Specific Question:   Supervising Provider    Answer:   Beatrice Lecher D [[id number]]   montelukast (SINGULAIR) 10 MG tablet    Sig: Take 1 tablet (10 mg total) by mouth at bedtime.    Dispense:  90 tablet    Refill:  1    Order Specific Question:   Supervising Provider    Answer:   Beatrice Lecher D [[id number]]     Medications Discontinued During This Encounter  Medication Reason   montelukast (SINGULAIR) 10 MG tablet Reorder   allopurinol (ZYLOPRIM) 100 MG tablet Reorder   Vitamin D, Ergocalciferol, (DRISDOL) 1.25 MG (50000 UNIT) CAPS capsule Reorder   losartan (COZAAR) 50 MG tablet Reorder       Time spent on telephone encounter was 10 minutes.      The [place] was signed into law in 2016 which includes the topic of electronic  health records.  This provides immediate access to information in MyChart.  This includes consultation notes, operative notes, office notes, lab results and pathology reports.  If you have any questions about what you read please let us know at your next visit or call us at the office.  We are right here with you.   __________________________________________________________________________________     Patient Care Team    Relationship Specialty Notifications Start End  David Aguilar, [place] PCP - General   11/30/19   Jacelyn Pi, MD Consulting Physician Endocrinology  08/20/15   Carolan Clines, MD (Inactive) Consulting Physician Urology  05/16/16      -Vitals obtained;  medications/ allergies reconciled;  personal medical, social, Sx etc.histories were updated by CMA, reviewed by me and are reflected in chart   Patient Active Problem List   Diagnosis Date Noted   Stress due to marital problems 03/29/2019   Environmental allergies 02/27/2019   Acute gouty arthropathy 10/12/2017   Vitamin D insufficiency 10/12/2017   Hypogonadism male 10/12/2017   Rhinosinusitis 09/27/2017   Acute right ankle pain 03/28/2017   Ear popping, right 01/29/2017   Otitis media with effusion, right 01/29/2017   Marital dysfunction 12/20/2016   Under emotional stress due to marital discourse  12/20/2016   Prediabetes 05/16/2016   Counseling on health promotion and disease prevention 05/16/2016   Environmental and seasonal allergies 05/16/2016   Reactive airway disease 03/22/2016   Asthma 01/03/2016   Hyperprolactinemia ([place]) 08/20/2015   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     Allergies:  Allergies  Allergen Reactions   Bee Venom      ROS:  See above HPI for pertinent positives and negatives   Objective:   Blood pressure 128/79, pulse 82, height 6\' 1"  (1.854 m), weight 282 lb (127.9 kg).  (if some vitals are omitted, this means that patient was UNABLE to obtain them.) General: A & O * 3; sounds in no acute distress Respiratory: speaking in full sentences, no conversational dyspnea Psych: insight appears good, mood- appears full

## 2021-04-29 ENCOUNTER — Other Ambulatory Visit: Payer: Self-pay

## 2021-04-29 DIAGNOSIS — Z0000 Encounter for general adult medical examination without abnormal findings: Secondary | ICD-10-CM

## 2021-04-29 DIAGNOSIS — R7303 Prediabetes: Secondary | ICD-10-CM

## 2021-04-29 DIAGNOSIS — M109 Gout, unspecified: Secondary | ICD-10-CM

## 2021-05-02 ENCOUNTER — Other Ambulatory Visit: Payer: BC Managed Care – PPO

## 2021-05-02 DIAGNOSIS — M109 Gout, unspecified: Secondary | ICD-10-CM | POA: Diagnosis not present

## 2021-05-02 DIAGNOSIS — Z0000 Encounter for general adult medical examination without abnormal findings: Secondary | ICD-10-CM

## 2021-05-02 DIAGNOSIS — R5383 Other fatigue: Secondary | ICD-10-CM | POA: Diagnosis not present

## 2021-05-02 DIAGNOSIS — R7303 Prediabetes: Secondary | ICD-10-CM

## 2021-05-02 DIAGNOSIS — E559 Vitamin D deficiency, unspecified: Secondary | ICD-10-CM | POA: Diagnosis not present

## 2021-05-03 LAB — LIPID PANEL
Chol/HDL Ratio: 3.3 ratio (ref 0.0–5.0)
Cholesterol, Total: 164 mg/dL (ref 100–199)
HDL: 50 mg/dL (ref >39)
LDL Chol Calc (NIH): 94 mg/dL (ref 0–99)
Triglycerides: 110 mg/dL (ref 0–149)
VLDL Cholesterol Cal: 20 mg/dL (ref 5–40)

## 2021-05-03 LAB — CBC WITH DIFFERENTIAL/PLATELET
Basophils Absolute: 0.1 10*3/uL (ref 0.0–0.2)
Basos: 1 %
EOS (ABSOLUTE): 0.3 10*3/uL (ref 0.0–0.4)
Eos: 4 %
Hematocrit: 41 % (ref 37.5–51.0)
Hemoglobin: 13.7 g/dL (ref 13.0–17.7)
Immature Grans (Abs): 0 10*3/uL (ref 0.0–0.1)
Immature Granulocytes: 0 %
Lymphocytes Absolute: 2.4 10*3/uL (ref 0.7–3.1)
Lymphs: 30 %
MCH: 28.5 pg (ref 26.6–33.0)
MCHC: 33.4 g/dL (ref 31.5–35.7)
MCV: 85 fL (ref 79–97)
Monocytes Absolute: 0.6 10*3/uL (ref 0.1–0.9)
Monocytes: 8 %
Neutrophils Absolute: 4.7 10*3/uL (ref 1.4–7.0)
Neutrophils: 57 %
Platelets: 260 10*3/uL (ref 150–450)
RBC: 4.81 x10E6/uL (ref 4.14–5.80)
RDW: 12.8 % (ref 11.6–15.4)
WBC: 8.1 10*3/uL (ref 3.4–10.8)

## 2021-05-03 LAB — COMPREHENSIVE METABOLIC PANEL
ALT: 22 IU/L (ref 0–44)
AST: 19 IU/L (ref 0–40)
Albumin/Globulin Ratio: 1.7 (ref 1.2–2.2)
Albumin: 4.2 g/dL (ref 4.0–5.0)
Alkaline Phosphatase: 63 IU/L (ref 44–121)
BUN/Creatinine Ratio: 18 (ref 9–20)
BUN: 16 mg/dL (ref 6–24)
Bilirubin Total: 0.3 mg/dL (ref 0.0–1.2)
CO2: 25 mmol/L (ref 20–29)
Calcium: 9.1 mg/dL (ref 8.7–10.2)
Chloride: 103 mmol/L (ref 96–106)
Creatinine, Ser: 0.88 mg/dL (ref 0.76–1.27)
Globulin, Total: 2.5 g/dL (ref 1.5–4.5)
Glucose: 77 mg/dL (ref 70–99)
Potassium: 4.8 mmol/L (ref 3.5–5.2)
Sodium: 141 mmol/L (ref 134–144)
Total Protein: 6.7 g/dL (ref 6.0–8.5)
eGFR: 105 mL/min/{1.73_m2} (ref >59)

## 2021-05-03 LAB — URIC ACID: Uric Acid: 7.9 mg/dL (ref 3.8–8.4)

## 2021-05-03 LAB — HEMOGLOBIN A1C
Est. average glucose Bld gHb Est-mCnc: 111 mg/dL
Hgb A1c MFr Bld: 5.5 % (ref 4.8–5.6)

## 2021-05-03 LAB — TSH: TSH: 1.89 u[IU]/mL (ref 0.450–4.500)

## 2021-05-07 LAB — VITAMIN D 25 HYDROXY (VIT D DEFICIENCY, FRACTURES): Vit D, 25-Hydroxy: 38.7 ng/mL (ref 30.0–100.0)

## 2021-05-07 LAB — SPECIMEN STATUS REPORT

## 2021-06-07 DIAGNOSIS — E291 Testicular hypofunction: Secondary | ICD-10-CM | POA: Diagnosis not present

## 2021-06-07 DIAGNOSIS — E221 Hyperprolactinemia: Secondary | ICD-10-CM | POA: Diagnosis not present

## 2021-06-07 DIAGNOSIS — R7301 Impaired fasting glucose: Secondary | ICD-10-CM | POA: Diagnosis not present

## 2021-06-07 DIAGNOSIS — D443 Neoplasm of uncertain behavior of pituitary gland: Secondary | ICD-10-CM | POA: Diagnosis not present

## 2021-06-13 DIAGNOSIS — E291 Testicular hypofunction: Secondary | ICD-10-CM | POA: Diagnosis not present

## 2021-06-14 DIAGNOSIS — D443 Neoplasm of uncertain behavior of pituitary gland: Secondary | ICD-10-CM | POA: Diagnosis not present

## 2021-06-14 DIAGNOSIS — E291 Testicular hypofunction: Secondary | ICD-10-CM | POA: Diagnosis not present

## 2021-06-14 DIAGNOSIS — E221 Hyperprolactinemia: Secondary | ICD-10-CM | POA: Diagnosis not present

## 2021-06-14 DIAGNOSIS — I10 Essential (primary) hypertension: Secondary | ICD-10-CM | POA: Diagnosis not present

## 2021-10-12 ENCOUNTER — Other Ambulatory Visit: Payer: Self-pay

## 2021-10-12 DIAGNOSIS — E559 Vitamin D deficiency, unspecified: Secondary | ICD-10-CM

## 2021-10-12 MED ORDER — VITAMIN D (ERGOCALCIFEROL) 1.25 MG (50000 UNIT) PO CAPS: 50000.0000 [IU] | ORAL_CAPSULE | ORAL | 0 refills | Status: DC

## 2021-11-15 ENCOUNTER — Other Ambulatory Visit: Payer: Self-pay

## 2021-11-15 DIAGNOSIS — I10 Essential (primary) hypertension: Secondary | ICD-10-CM

## 2021-11-15 MED ORDER — LOSARTAN POTASSIUM 50 MG PO TABS: 50.0000 mg | ORAL_TABLET | Freq: Every day | ORAL | 0 refills | Status: DC

## 2022-02-27 ENCOUNTER — Telehealth: Payer: Self-pay | Admitting: Physician Assistant

## 2022-02-27 DIAGNOSIS — I10 Essential (primary) hypertension: Secondary | ICD-10-CM

## 2022-02-27 MED ORDER — LOSARTAN POTASSIUM 50 MG PO TABS: 50.0000 mg | ORAL_TABLET | Freq: Every day | ORAL | 0 refills | Status: DC

## 2022-02-27 NOTE — Telephone Encounter (Signed)
Refill has been sent. AS, CMA

## 2022-02-27 NOTE — Telephone Encounter (Signed)
Patient made appointment for next Thursday August 10th for refills of bp medication however he is completely out and he wants to know if you can send him in enough to get him through until his appointment? Please advise. [facility]

## 2022-03-09 ENCOUNTER — Encounter: Payer: Self-pay | Admitting: Physician Assistant

## 2022-03-09 ENCOUNTER — Ambulatory Visit (INDEPENDENT_AMBULATORY_CARE_PROVIDER_SITE_OTHER): Payer: 59 | Admitting: Physician Assistant

## 2022-03-09 VITALS — BP 120/83 | HR 74 | Temp 98.0°F | Ht 73.0 in | Wt 279.0 lb

## 2022-03-09 DIAGNOSIS — R7303 Prediabetes: Secondary | ICD-10-CM | POA: Diagnosis not present

## 2022-03-09 DIAGNOSIS — I10 Essential (primary) hypertension: Secondary | ICD-10-CM | POA: Diagnosis not present

## 2022-03-09 DIAGNOSIS — E221 Hyperprolactinemia: Secondary | ICD-10-CM | POA: Diagnosis not present

## 2022-03-09 DIAGNOSIS — M109 Gout, unspecified: Secondary | ICD-10-CM | POA: Diagnosis not present

## 2022-03-09 MED ORDER — TRULICITY 0.75 MG/0.5ML ~~LOC~~ SOAJ: 0.7500 mg | SUBCUTANEOUS | 0 refills | Status: DC

## 2022-03-09 MED ORDER — LOSARTAN POTASSIUM 50 MG PO TABS: 50.0000 mg | ORAL_TABLET | Freq: Every day | ORAL | 1 refills | Status: DC

## 2022-03-09 NOTE — Assessment & Plan Note (Addendum)
-  No exacerbations. Discussed with patient we can trial stopping medication (allopurinol 100 mg), if has recurrent gout then recommend resuming medication therapy. Pt verbalized understanding. Recommend to avoid high-purine foods. Will continue to monitor.

## 2022-03-09 NOTE — Patient Instructions (Signed)

## 2022-03-09 NOTE — Assessment & Plan Note (Signed)
-  Followed by endocrinology. On Trulicity 9.43 mg. Advised patient forward endocrinology labs. Encourage to continue weight loss efforts with diet and lifestyle changes. Will continue to monitor.

## 2022-03-09 NOTE — Progress Notes (Signed)
Established patient visit   Patient: David Aguilar   DOB: [birth year]   51 y.o. Male  MRN: [id number] Visit Date: 03/09/2022  Chief Complaint  Patient presents with   Follow-up    Medication refill   Subjective    HPI HPI     Follow-up    Additional comments: Medication refill      Last edited by Adelfa Koh, CMA on 03/09/2022  1:16 PM.      Patient presents for follow-up on hypertension. Patient reports was started on montelukast for wheezing and recurrent bronchitis.  HTN: Pt denies chest pain, palpitations, dizziness or lower extremity swelling. Taking medication as directed without side effects.   Gout: Patient reports has not been taking allopurinol consistently and has not experienced a gout exacerbation.   Prediabetes: Dr. Chalmers Cater- endocrinologist started Trulicity 5.32 mg for prediabetes last November.       03/09/2022    1:17 PM 04/28/2021    1:21 PM 08/23/2020    8:19 AM 11/21/2019    8:11 AM 02/27/2019    1:48 PM  Depression screen PHQ 2/9  Decreased Interest 0 0 0 0 0  Down, Depressed, Hopeless 0 0 0 0 0  PHQ - 2 Score 0 0 0 0 0  Altered sleeping 0 0 0 0 0  Tired, decreased energy 0 1 0 0 0  Change in appetite 0 0 0 0 0  Feeling bad or failure about yourself  0 0 0 0 0  Trouble concentrating 0 0 0 0 0  Moving slowly or fidgety/restless 0 0 0 0 0  Suicidal thoughts 0 0 0 0 0  PHQ-9 Score 0 1 0 0 0  Difficult doing work/chores Not difficult at all Not difficult at all         03/09/2022    1:17 PM 04/28/2021    1:23 PM 02/27/2019    1:48 PM  GAD 7 : Generalized Anxiety Score  Nervous, Anxious, on Edge 0 1 1  Control/stop worrying 0 0 1  Worry too much - different things 0 0 1  Trouble relaxing 0 0 0  Restless 0 0 0  Easily annoyed or irritable 0 0 0  Afraid - awful might happen 0 0 0  Total GAD 7 Score 0 1 3  Anxiety Difficulty Not difficult at all  Somewhat difficult        Medications: Outpatient Medications Prior to Visit   Medication Sig   allopurinol (ZYLOPRIM) 100 MG tablet Take 1 tablet (100 mg total) by mouth daily.   AUVI-Q 0.3 MG/0.3ML SOAJ injection Use as directed for life-threatening allergic reaction.   Azelastine HCl 137 MCG/SPRAY SOLN Use one spray in each nostril twice daily   Azelastine-Fluticasone 137-50 MCG/ACT SUSP Use one spray in each nostril twice daily as directed.   cabergoline (DOSTINEX) 0.5 MG tablet Take 1 tablet by mouth 2 (two) times a week.   clomiPHENE (CLOMID) 50 MG tablet Take 50 mg by mouth daily.   fluticasone (FLONASE) 50 MCG/ACT nasal spray Use one spray in each nostril twice daily   montelukast (SINGULAIR) 10 MG tablet Take 1 tablet (10 mg total) by mouth at bedtime.   oxymetazoline (AFRIN) 0.05 % nasal spray    Vitamin D, Ergocalciferol, (DRISDOL) 1.25 MG (50000 UNIT) CAPS capsule Take 1 capsule (50,000 Units total) by mouth every 7 (seven) days.   [DISCONTINUED] losartan (COZAAR) 50 MG tablet Take 1 tablet (50 mg total) by mouth daily. **PLEASE CONTACT OUR OFFICE  TO SCHEDULE A FOLLOW UP FOR FUTURE MED REFILLS**   [DISCONTINUED] metFORMIN (GLUCOPHAGE-XR) 500 MG 24 hr tablet Take 1 tablet by mouth daily with supper.   nitroGLYCERIN (NITRODUR - DOSED IN MG/24 HR) 0.2 mg/hr patch nitroglycerin 0.2 mg/hr transdermal 24 hour patch  APPLY 1/2 PATCH ONTO THE SKIN EVERY DAY FOR 90 DAYS (Patient not taking: Reported on 03/09/2022)   No facility-administered medications prior to visit.    Review of Systems Review of Systems:  A fourteen system review of systems was performed and found to be positive as per HPI.  Last CBC Lab Results  Component Value Date   WBC 8.1 05/02/2021   HGB 13.7 05/02/2021   HCT 41.0 05/02/2021   MCV 85 05/02/2021   MCH 28.5 05/02/2021   RDW 12.8 05/02/2021   PLT 260 44/07/270   Last metabolic panel Lab Results  Component Value Date   GLUCOSE 77 05/02/2021   NA 141 05/02/2021   K 4.8 05/02/2021   CL 103 05/02/2021   CO2 25 05/02/2021   BUN  16 05/02/2021   CREATININE 0.88 05/02/2021   EGFR 105 05/02/2021   CALCIUM 9.1 05/02/2021   PROT 6.7 05/02/2021   ALBUMIN 4.2 05/02/2021   LABGLOB 2.5 05/02/2021   AGRATIO 1.7 05/02/2021   BILITOT 0.3 05/02/2021   ALKPHOS 63 05/02/2021   AST 19 05/02/2021   ALT 22 05/02/2021   Last lipids Lab Results  Component Value Date   CHOL 164 05/02/2021   HDL 50 05/02/2021   LDLCALC 94 05/02/2021   TRIG 110 05/02/2021   CHOLHDL 3.3 05/02/2021   Last hemoglobin A1c Lab Results  Component Value Date   HGBA1C 5.5 05/02/2021   Last thyroid functions Lab Results  Component Value Date   TSH 1.890 05/02/2021   Last vitamin D Lab Results  Component Value Date   VD25OH 38.7 05/02/2021     Objective    BP 120/83   Pulse 74   Temp 98 F (36.7 C) (Temporal)   Ht 6' 1"  (1.854 m)   Wt 279 lb (126.6 kg)   BMI 36.81 kg/m  BP Readings from Last 3 Encounters:  03/09/22 120/83  04/28/21 128/79  08/23/20 122/83   Wt Readings from Last 3 Encounters:  03/09/22 279 lb (126.6 kg)  04/28/21 282 lb (127.9 kg)  08/23/20 297 lb 8 oz (134.9 kg)    Physical Exam  General:  Well Developed, well nourished, appropriate for stated age.  Neuro:  Alert and oriented,  extra-ocular muscles intact  HEENT:  Normocephalic, atraumatic, neck supple  Skin:  no gross rash, warm, pink. Cardiac:  RRR, S1 S2 Respiratory: CTA B/L  Vascular:  Ext warm, no cyanosis apprec.; cap RF less 2 sec. Psych:  No HI/SI, judgement and insight good, Euthymic mood. Full Affect.   No results found for any visits on 03/09/22.  Assessment & Plan      Problem List Items Addressed This Visit       Cardiovascular and Mediastinum   Essential hypertension - Primary (Chronic)    -Stable. Continue current medication regimen. Advised patient to forward last labs done with endocrinology as well as upcoming labs. If no recent CMP performed, then recommend obtaining at f/up visit.      Relevant Medications   losartan  (COZAAR) 50 MG tablet     Endocrine   Hyperprolactinemia ([place])    -Followed by endocrinology.        Musculoskeletal and Integument   Acute gouty arthropathy    -  No exacerbations. Discussed with patient we can trial stopping medication (allopurinol 100 mg), if has recurrent gout then recommend resuming medication therapy. Pt verbalized understanding. Recommend to avoid high-purine foods. Will continue to monitor.        Other   Prediabetes (Chronic)    -Followed by endocrinology. On Trulicity 4.19 mg. Advised patient forward endocrinology labs. Encourage to continue weight loss efforts with diet and lifestyle changes. Will continue to monitor.      Relevant Medications   Dulaglutide (TRULICITY) 3.79 KW/4.0XB SOPN    Return in about 6 months (around 09/09/2022) for HTN, gout, prediabetes.        Lorrene Reid, PA-C  [facility] [phone] (phone) [phone] (fax)  [place]

## 2022-03-09 NOTE — Assessment & Plan Note (Signed)
Followed by endocrinology 

## 2022-03-09 NOTE — Assessment & Plan Note (Signed)
-  Stable. Continue current medication regimen. Advised patient to forward last labs done with endocrinology as well as upcoming labs. If no recent CMP performed, then recommend obtaining at f/up visit.

## 2022-05-17 ENCOUNTER — Telehealth: Payer: Self-pay

## 2022-05-17 NOTE — Telephone Encounter (Signed)
Spoke to patient to remind him that he needs to send in his labs from his endocrinologist before his refill request can be sent since he has not had labs done here since last year.

## 2022-05-17 NOTE — Telephone Encounter (Signed)
Patient called requesting med refill on his vitamin D, please advise, thanks!

## 2022-09-08 ENCOUNTER — Ambulatory Visit: Payer: 59 | Admitting: Physician Assistant

## 2022-09-26 ENCOUNTER — Other Ambulatory Visit: Payer: Self-pay

## 2022-09-26 DIAGNOSIS — I10 Essential (primary) hypertension: Secondary | ICD-10-CM

## 2022-09-26 MED ORDER — LOSARTAN POTASSIUM 50 MG PO TABS: 50.0000 mg | ORAL_TABLET | Freq: Every day | ORAL | 0 refills | Status: DC

## 2022-09-29 NOTE — Progress Notes (Unsigned)
   Established Patient Office Visit  Subjective   Patient ID: David Aguilar, male    DOB: 11-05-1970  Age: 52 y.o. MRN: [id number]  No chief complaint on file.   HPI David Aguilar is a 52 y.o. male presenting today for follow up of hypertension, gout, prediabetes. Hypertension: Patient here for follow-up of elevated blood pressure. He {is/is not:9024} exercising and {is/is not:9024} adherent to low salt diet.   Pt denies chest pain, SOB, dizziness, edema, syncope, fatigue or heart palpitations. Taking ***, reports {excellent/good/fair/poor:19665} compliance with treatment. Denies side effects. Gout: currently well controlled on Prediabetes: denies hypoglycemic events, wounds or sores that are not healing well, increased thirst or urination. Has been following *** diet and *** for exercise.  ROS Negative unless otherwise noted in HPI   Objective:     There were no vitals taken for this visit.  Physical Exam   No results found for any visits on 10/02/22.  {Labs (Optional):23779}  The 10-year ASCVD risk score (Arnett DK, et al., 2019) is: 3.4%    Assessment & Plan:  There are no diagnoses linked to this encounter.  No follow-ups on file.    Velva Harman, PA

## 2022-10-02 ENCOUNTER — Telehealth (INDEPENDENT_AMBULATORY_CARE_PROVIDER_SITE_OTHER): Payer: 59 | Admitting: Family Medicine

## 2022-10-02 ENCOUNTER — Encounter: Payer: Self-pay | Admitting: Family Medicine

## 2022-10-02 DIAGNOSIS — R7303 Prediabetes: Secondary | ICD-10-CM | POA: Diagnosis not present

## 2022-10-02 DIAGNOSIS — I10 Essential (primary) hypertension: Secondary | ICD-10-CM

## 2022-10-02 DIAGNOSIS — E559 Vitamin D deficiency, unspecified: Secondary | ICD-10-CM

## 2022-10-02 DIAGNOSIS — R0982 Postnasal drip: Secondary | ICD-10-CM

## 2022-10-02 DIAGNOSIS — M109 Gout, unspecified: Secondary | ICD-10-CM

## 2022-10-02 DIAGNOSIS — J3089 Other allergic rhinitis: Secondary | ICD-10-CM

## 2022-10-02 DIAGNOSIS — J309 Allergic rhinitis, unspecified: Secondary | ICD-10-CM

## 2022-10-02 MED ORDER — MELOXICAM 15 MG PO TABS: 15.0000 mg | ORAL_TABLET | ORAL | 1 refills | Status: DC | PRN

## 2022-10-02 MED ORDER — ALLOPURINOL 100 MG PO TABS: 100.0000 mg | ORAL_TABLET | Freq: Every day | ORAL | 1 refills | Status: DC

## 2022-10-02 MED ORDER — LOSARTAN POTASSIUM 50 MG PO TABS: 50.0000 mg | ORAL_TABLET | Freq: Every day | ORAL | 1 refills | Status: DC

## 2022-10-02 MED ORDER — COLCHICINE 0.6 MG PO TABS
ORAL_TABLET | ORAL | 0 refills | Status: AC
Start: 2022-10-02 — End: ?

## 2022-10-02 MED ORDER — MONTELUKAST SODIUM 10 MG PO TABS: 10.0000 mg | ORAL_TABLET | Freq: Every day | ORAL | 1 refills | Status: DC

## 2022-10-02 NOTE — Assessment & Plan Note (Signed)
With current exacerbation, sending in new prescriptions of meloxicam and colchicine.  We discussed that since he has now had an exacerbation during the trial of discontinuing the allopurinol, he could either restart the allopurinol or stay off the medication but really commit to completely avoiding all purine rich foods.  He would rather restart the allopurinol, sending in a new prescription today.  We will also test serum uric acid level since that has not been done in quite some time.

## 2022-10-02 NOTE — Assessment & Plan Note (Signed)
Will repeat vitamin D today.  Pending results may encourage him to restart his vitamin D supplement.

## 2022-10-02 NOTE — Assessment & Plan Note (Signed)
Stable.  Continue losartan 50 mg daily.  Putting in orders for blood work today including CMP, he will come by to have them done sometime in the next week.

## 2022-10-02 NOTE — Assessment & Plan Note (Signed)
Followed by endocrinology.  Continue Trulicity [id number] mg.  Encouraged to continue weight loss efforts which have been very successful so far.  Trying A1c with next labs.  Will continue to monitor.

## 2022-10-05 ENCOUNTER — Other Ambulatory Visit: Payer: 59

## 2022-10-06 ENCOUNTER — Other Ambulatory Visit: Payer: 59

## 2022-10-06 DIAGNOSIS — M109 Gout, unspecified: Secondary | ICD-10-CM

## 2022-10-06 DIAGNOSIS — E559 Vitamin D deficiency, unspecified: Secondary | ICD-10-CM

## 2022-10-06 DIAGNOSIS — R7303 Prediabetes: Secondary | ICD-10-CM

## 2022-10-06 DIAGNOSIS — I10 Essential (primary) hypertension: Secondary | ICD-10-CM

## 2022-10-07 LAB — CBC WITH DIFFERENTIAL/PLATELET
Basophils Absolute: 0.1 10*3/uL (ref 0.0–0.2)
Basos: 1 %
EOS (ABSOLUTE): 0.3 10*3/uL (ref 0.0–0.4)
Eos: 5 %
Hematocrit: 42.6 % (ref 37.5–51.0)
Hemoglobin: 14.2 g/dL (ref 13.0–17.7)
Immature Grans (Abs): 0 10*3/uL (ref 0.0–0.1)
Immature Granulocytes: 0 %
Lymphocytes Absolute: 1.9 10*3/uL (ref 0.7–3.1)
Lymphs: 28 %
MCH: 29.6 pg (ref 26.6–33.0)
MCHC: 33.3 g/dL (ref 31.5–35.7)
MCV: 89 fL (ref 79–97)
Monocytes Absolute: 0.6 10*3/uL (ref 0.1–0.9)
Monocytes: 8 %
Neutrophils Absolute: 4 10*3/uL (ref 1.4–7.0)
Neutrophils: 58 %
Platelets: 302 10*3/uL (ref 150–450)
RBC: 4.79 x10E6/uL (ref 4.14–5.80)
RDW: 12.6 % (ref 11.6–15.4)
WBC: 6.9 10*3/uL (ref 3.4–10.8)

## 2022-10-07 LAB — COMPREHENSIVE METABOLIC PANEL
ALT: 12 IU/L (ref 0–44)
AST: 12 IU/L (ref 0–40)
Albumin/Globulin Ratio: 1.4 (ref 1.2–2.2)
Albumin: 4.4 g/dL (ref 3.8–4.9)
Alkaline Phosphatase: 64 IU/L (ref 44–121)
BUN/Creatinine Ratio: 14 (ref 9–20)
BUN: 13 mg/dL (ref 6–24)
Bilirubin Total: 0.4 mg/dL (ref 0.0–1.2)
CO2: 24 mmol/L (ref 20–29)
Calcium: 9.4 mg/dL (ref 8.7–10.2)
Chloride: 102 mmol/L (ref 96–106)
Creatinine, Ser: 0.95 mg/dL (ref 0.76–1.27)
Globulin, Total: 3.1 g/dL (ref 1.5–4.5)
Glucose: 79 mg/dL (ref 70–99)
Potassium: 4.6 mmol/L (ref 3.5–5.2)
Sodium: 140 mmol/L (ref 134–144)
Total Protein: 7.5 g/dL (ref 6.0–8.5)
eGFR: 96 mL/min/{1.73_m2} (ref >59)

## 2022-10-07 LAB — HEMOGLOBIN A1C
Est. average glucose Bld gHb Est-mCnc: 111 mg/dL
Hgb A1c MFr Bld: 5.5 % (ref 4.8–5.6)

## 2022-10-07 LAB — VITAMIN D 25 HYDROXY (VIT D DEFICIENCY, FRACTURES): Vit D, 25-Hydroxy: 27.1 ng/mL — ABNORMAL LOW (ref 30.0–100.0)

## 2022-10-07 LAB — URIC ACID: Uric Acid: 6.6 mg/dL (ref 3.8–8.4)

## 2022-10-09 ENCOUNTER — Encounter: Payer: Self-pay | Admitting: Family Medicine

## 2022-10-09 DIAGNOSIS — E559 Vitamin D deficiency, unspecified: Secondary | ICD-10-CM

## 2022-10-09 MED ORDER — VITAMIN D (ERGOCALCIFEROL) 1.25 MG (50000 UNIT) PO CAPS: 50000.0000 [IU] | ORAL_CAPSULE | ORAL | 2 refills | Status: DC

## 2023-02-05 ENCOUNTER — Telehealth: Payer: Self-pay | Admitting: *Deleted

## 2023-02-05 NOTE — Telephone Encounter (Signed)
Now that he is finished the course of prescription strength vitamin D, he can take over-the-counter vitamin D3 2000 or 5000 units daily.  Since his vitamin D has been low in the past, I would recommend the 5000 units daily and we will be rechecking his vitamin D before his annual physical.

## 2023-02-05 NOTE — Telephone Encounter (Signed)
Pt informed

## 2023-02-05 NOTE — Telephone Encounter (Signed)
Pt calling about vitamin d, he said there are no refills on his medication and he is requesting a refill.  I read him the lab result that mentions getting OTC vitamin d and he wanted to see what strength he should take. Please advise.

## 2023-03-27 ENCOUNTER — Other Ambulatory Visit: Payer: Self-pay

## 2023-03-27 DIAGNOSIS — I10 Essential (primary) hypertension: Secondary | ICD-10-CM

## 2023-03-27 MED ORDER — LOSARTAN POTASSIUM 50 MG PO TABS: 50.0000 mg | ORAL_TABLET | Freq: Every day | ORAL | 0 refills | Status: DC

## 2023-04-10 ENCOUNTER — Other Ambulatory Visit: Payer: Self-pay | Admitting: Family Medicine

## 2023-04-10 DIAGNOSIS — R7303 Prediabetes: Secondary | ICD-10-CM

## 2023-04-10 DIAGNOSIS — I10 Essential (primary) hypertension: Secondary | ICD-10-CM

## 2023-04-10 DIAGNOSIS — E221 Hyperprolactinemia: Secondary | ICD-10-CM

## 2023-04-10 DIAGNOSIS — E559 Vitamin D deficiency, unspecified: Secondary | ICD-10-CM

## 2023-04-16 ENCOUNTER — Other Ambulatory Visit: Payer: 59

## 2023-04-16 DIAGNOSIS — I10 Essential (primary) hypertension: Secondary | ICD-10-CM

## 2023-04-16 DIAGNOSIS — R7303 Prediabetes: Secondary | ICD-10-CM

## 2023-04-16 DIAGNOSIS — E559 Vitamin D deficiency, unspecified: Secondary | ICD-10-CM

## 2023-04-17 LAB — COMPREHENSIVE METABOLIC PANEL
ALT: 10 IU/L (ref 0–44)
AST: 10 IU/L (ref 0–40)
Albumin: 4.1 g/dL (ref 3.8–4.9)
Alkaline Phosphatase: 61 IU/L (ref 44–121)
BUN/Creatinine Ratio: 16 (ref 9–20)
BUN: 18 mg/dL (ref 6–24)
Bilirubin Total: 0.5 mg/dL (ref 0.0–1.2)
CO2: 26 mmol/L (ref 20–29)
Calcium: 9.4 mg/dL (ref 8.7–10.2)
Chloride: 102 mmol/L (ref 96–106)
Creatinine, Ser: 1.1 mg/dL (ref 0.76–1.27)
Globulin, Total: 2.7 g/dL (ref 1.5–4.5)
Glucose: 73 mg/dL (ref 70–99)
Potassium: 4.1 mmol/L (ref 3.5–5.2)
Sodium: 140 mmol/L (ref 134–144)
Total Protein: 6.8 g/dL (ref 6.0–8.5)
eGFR: 81 mL/min/{1.73_m2} (ref >59)

## 2023-04-17 LAB — CBC WITH DIFFERENTIAL/PLATELET
Basophils Absolute: 0.1 10*3/uL (ref 0.0–0.2)
Basos: 1 %
EOS (ABSOLUTE): 0.3 10*3/uL (ref 0.0–0.4)
Eos: 4 %
Hematocrit: 41.3 % (ref 37.5–51.0)
Hemoglobin: 13.2 g/dL (ref 13.0–17.7)
Immature Grans (Abs): 0 10*3/uL (ref 0.0–0.1)
Immature Granulocytes: 0 %
Lymphocytes Absolute: 2.5 10*3/uL (ref 0.7–3.1)
Lymphs: 34 %
MCH: 28.8 pg (ref 26.6–33.0)
MCHC: 32 g/dL (ref 31.5–35.7)
MCV: 90 fL (ref 79–97)
Monocytes Absolute: 0.6 10*3/uL (ref 0.1–0.9)
Monocytes: 8 %
Neutrophils Absolute: 4 10*3/uL (ref 1.4–7.0)
Neutrophils: 53 %
Platelets: 260 10*3/uL (ref 150–450)
RBC: 4.58 x10E6/uL (ref 4.14–5.80)
RDW: 12.2 % (ref 11.6–15.4)
WBC: 7.5 10*3/uL (ref 3.4–10.8)

## 2023-04-17 LAB — LIPID PANEL
Chol/HDL Ratio: 2.8 ratio (ref 0.0–5.0)
Cholesterol, Total: 163 mg/dL (ref 100–199)
HDL: 59 mg/dL (ref >39)
LDL Chol Calc (NIH): 87 mg/dL (ref 0–99)
Triglycerides: 90 mg/dL (ref 0–149)
VLDL Cholesterol Cal: 17 mg/dL (ref 5–40)

## 2023-04-17 LAB — TSH RFX ON ABNORMAL TO FREE T4: TSH: 1.5 u[IU]/mL (ref 0.450–4.500)

## 2023-04-17 LAB — HEMOGLOBIN A1C
Est. average glucose Bld gHb Est-mCnc: 108 mg/dL
Hgb A1c MFr Bld: 5.4 % (ref 4.8–5.6)

## 2023-04-17 LAB — VITAMIN D 25 HYDROXY (VIT D DEFICIENCY, FRACTURES): Vit D, 25-Hydroxy: 42.8 ng/mL (ref 30.0–100.0)

## 2023-04-17 LAB — PROLACTIN: Prolactin: 12.6 ng/mL (ref 3.6–25.2)

## 2023-04-26 ENCOUNTER — Encounter: Payer: 59 | Admitting: Family Medicine

## 2023-05-17 ENCOUNTER — Encounter: Payer: Self-pay | Admitting: Family Medicine

## 2023-05-17 ENCOUNTER — Ambulatory Visit (INDEPENDENT_AMBULATORY_CARE_PROVIDER_SITE_OTHER): Payer: 59 | Admitting: Family Medicine

## 2023-05-17 VITALS — BP 122/81 | HR 70 | Resp 18 | Ht 73.0 in | Wt 246.0 lb

## 2023-05-17 DIAGNOSIS — M109 Gout, unspecified: Secondary | ICD-10-CM

## 2023-05-17 DIAGNOSIS — J3089 Other allergic rhinitis: Secondary | ICD-10-CM

## 2023-05-17 DIAGNOSIS — G250 Essential tremor: Secondary | ICD-10-CM | POA: Insufficient documentation

## 2023-05-17 DIAGNOSIS — R7303 Prediabetes: Secondary | ICD-10-CM

## 2023-05-17 DIAGNOSIS — I10 Essential (primary) hypertension: Secondary | ICD-10-CM | POA: Diagnosis not present

## 2023-05-17 DIAGNOSIS — E221 Hyperprolactinemia: Secondary | ICD-10-CM

## 2023-05-17 MED ORDER — MONTELUKAST SODIUM 10 MG PO TABS: 10.0000 mg | ORAL_TABLET | Freq: Every day | ORAL | 1 refills | Status: AC

## 2023-05-17 MED ORDER — ALLOPURINOL 100 MG PO TABS: 100.0000 mg | ORAL_TABLET | Freq: Every day | ORAL | 1 refills | Status: AC

## 2023-05-17 MED ORDER — LOSARTAN POTASSIUM 50 MG PO TABS: 50.0000 mg | ORAL_TABLET | Freq: Every day | ORAL | 1 refills | Status: AC

## 2023-05-17 NOTE — Assessment & Plan Note (Signed)
BP goal <140/90. Stable, at goal.  Continue losartan 50 mg daily.  CMP within normal limits.  Continue ambulatory blood pressure monitoring, advised to replace batteries to ensure accuracy.  Will continue to monitor.

## 2023-05-17 NOTE — Assessment & Plan Note (Signed)
Stable, continue allopurinol 100 mg daily for prophylaxis.  Can use colchicine or meloxicam for any exacerbations.  Will continue to monitor.

## 2023-05-17 NOTE — Progress Notes (Signed)
Established Patient Office Visit  Subjective   Patient ID: David Aguilar, male    DOB: 08/22/1970  Age: 52 y.o. MRN: [id number]  Chief Complaint  Patient presents with   Hypertension    HPI David Aguilar is a 52 y.o. male presenting today for follow up of hypertension.  He would also like to move his annual physical earlier than November if possible as his insurance offers a wellness incentive that ends on October 31. Hypertension: Patient here for follow-up of elevated blood pressure. He is exercising and is adherent to low salt diet.   Pt denies chest pain, SOB, dizziness, edema, syncope, fatigue or heart palpitations. Taking losartan, reports excellent compliance with treatment. Denies side effects.  He has had increased stress over the past few days and has noticed high readings at home with systolic in the 140s and diastolic often in the 90s.   Outpatient Medications Prior to Visit  Medication Sig   buPROPion (WELLBUTRIN SR) 150 MG 12 hr tablet Take 150 mg by mouth daily.   cabergoline (DOSTINEX) 0.5 MG tablet Take 0.5 mg by mouth 2 (two) times a week.   clomiPHENE (CLOMID) 50 MG tablet Take 50 mg by mouth daily.   clonazePAM (KLONOPIN) 0.5 MG tablet Take 0.5 mg by mouth daily as needed.   colchicine 0.6 MG tablet 1.2 mg p.o. X 1  Then 0.6 mg p.o. 1 hour later x 1 at first onset of gouty flare.   meloxicam (MOBIC) 15 MG tablet Take 1 tablet (15 mg total) by mouth as needed. One tablet daily.   oxymetazoline (AFRIN) 0.05 % nasal spray    tadalafil (CIALIS) 5 MG tablet Take 5 mg by mouth daily as needed.   TRULICITY 1.5 MG/0.5ML SOPN Inject 1.5 mg into the skin once a week.   [DISCONTINUED] allopurinol (ZYLOPRIM) 100 MG tablet Take 1 tablet (100 mg total) by mouth daily.   [DISCONTINUED] losartan (COZAAR) 50 MG tablet Take 1 tablet (50 mg total) by mouth daily.   [DISCONTINUED] montelukast (SINGULAIR) 10 MG tablet Take 1 tablet (10 mg total) by mouth at bedtime.    [DISCONTINUED] Vitamin D, Ergocalciferol, (DRISDOL) 1.25 MG (50000 UNIT) CAPS capsule Take 1 capsule (50,000 Units total) by mouth every 7 (seven) days.   [DISCONTINUED] Dulaglutide (TRULICITY) 0.75 MG/0.5ML SOPN Inject 0.75 mg into the skin once a week. (Patient taking differently: Inject 1.5 mg into the skin once a week.)   [DISCONTINUED] fluticasone (FLONASE) 50 MCG/ACT nasal spray Use one spray in each nostril twice daily (Patient not taking: Reported on 10/02/2022)   No facility-administered medications prior to visit.    ROS Negative unless otherwise noted in HPI   Objective:     BP 122/81 (BP Location: Left Arm, Patient Position: Sitting, Cuff Size: Large)   Pulse 70   Resp 18   Ht 6\' 1"  (1.854 m)   Wt 246 lb (111.6 kg)   SpO2 100%   BMI 32.46 kg/m   Physical Exam Constitutional:      General: He is not in acute distress.    Appearance: Normal appearance.  HENT:     Head: Normocephalic and atraumatic.  Cardiovascular:     Rate and Rhythm: Normal rate and regular rhythm.     Heart sounds: Normal heart sounds. No murmur heard.    No friction rub. No gallop.  Pulmonary:     Effort: Pulmonary effort is normal. No respiratory distress.     Breath sounds: Normal breath sounds. No  wheezing, rhonchi or rales.  Skin:    General: Skin is warm and dry.  Neurological:     Mental Status: He is alert and oriented to person, place, and time.  Psychiatric:        Mood and Affect: Mood normal.     Assessment & Plan:  Essential hypertension Assessment & Plan: BP goal <140/90. Stable, at goal.  Continue losartan 50 mg daily.  CMP within normal limits.  Continue ambulatory blood pressure monitoring, advised to replace batteries to ensure accuracy.  Will continue to monitor.  Orders: -     Losartan Potassium; Take 1 tablet (50 mg total) by mouth daily.  Dispense: 90 tablet; Refill: 1  Acute gouty arthropathy- b/l ankles Assessment & Plan: Stable, continue allopurinol 100 mg daily  for prophylaxis.  Can use colchicine or meloxicam for any exacerbations.  Will continue to monitor.  Orders: -     Allopurinol; Take 1 tablet (100 mg total) by mouth daily.  Dispense: 90 tablet; Refill: 1  Environmental and seasonal allergies -     Montelukast Sodium; Take 1 tablet (10 mg total) by mouth at bedtime.  Dispense: 90 tablet; Refill: 1  Hyperprolactinemia ([facility]) Assessment & Plan: Continue annual follow-up with endocrinology.  Continue cabergoline 0.5 mg twice weekly.  Prolactin within normal limits.   Prediabetes Assessment & Plan: Followed by endocrinology.  Continue Trulicity 0.75 mg.  Encouraged to continue weight loss efforts which have been very successful so far.  A1c 5.5.  Will continue to monitor.     Return annual physical by October 31.    Melida Quitter, PA

## 2023-05-17 NOTE — Patient Instructions (Signed)
Vitamin D levels are normal, so you can take an over the counter daily vitamin D3 1000 units daily supplement to maintain them.

## 2023-05-17 NOTE — Assessment & Plan Note (Signed)
Continue annual follow-up with endocrinology.  Continue cabergoline 0.5 mg twice weekly.  Prolactin within normal limits.

## 2023-05-17 NOTE — Assessment & Plan Note (Signed)
Followed by endocrinology.  Continue Trulicity 0.75 mg.  Encouraged to continue weight loss efforts which have been very successful so far.  A1c 5.5.  Will continue to monitor.

## 2023-05-24 ENCOUNTER — Encounter: Payer: Self-pay | Admitting: Family Medicine

## 2023-05-24 ENCOUNTER — Ambulatory Visit (INDEPENDENT_AMBULATORY_CARE_PROVIDER_SITE_OTHER): Payer: 59 | Admitting: Family Medicine

## 2023-05-24 VITALS — BP 135/88 | HR 82 | Resp 18 | Ht 73.0 in | Wt 248.0 lb

## 2023-05-24 DIAGNOSIS — Z23 Encounter for immunization: Secondary | ICD-10-CM | POA: Diagnosis not present

## 2023-05-24 DIAGNOSIS — G250 Essential tremor: Secondary | ICD-10-CM | POA: Diagnosis not present

## 2023-05-24 DIAGNOSIS — Z0000 Encounter for general adult medical examination without abnormal findings: Secondary | ICD-10-CM | POA: Diagnosis not present

## 2023-05-24 DIAGNOSIS — Z1159 Encounter for screening for other viral diseases: Secondary | ICD-10-CM

## 2023-05-24 DIAGNOSIS — I10 Essential (primary) hypertension: Secondary | ICD-10-CM | POA: Diagnosis not present

## 2023-05-24 DIAGNOSIS — G4700 Insomnia, unspecified: Secondary | ICD-10-CM

## 2023-05-24 NOTE — Assessment & Plan Note (Signed)
BP goal <140/90. Stable, at goal.  Continue losartan 50 mg daily.  CMP within normal limits.  Continue ambulatory blood pressure monitoring.  Will continue to monitor.

## 2023-05-24 NOTE — Assessment & Plan Note (Signed)
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  Option to start propranolol for tremor and additional benefit of potentially lowering blood pressure.  Patient would like to hold off for now, but may start in the future if needed.

## 2023-05-24 NOTE — Patient Instructions (Addendum)
As long as your blood pressure remains under 140 on the top AND under 90 on the bottom, you are right at your goal.  If it does start to consistently be higher than either of those numbers, please let me know.  You can try sending a copy of this summary to your insurance company, or ask at the front desk if there is anything else that they can print out for you like a bill.

## 2023-05-24 NOTE — Progress Notes (Signed)
Complete physical exam  Patient: David Aguilar   DOB: [birth year]   52 y.o. Male  MRN: [id number]  Subjective:    Chief Complaint  Patient presents with   Annual Exam    David Aguilar is a 52 y.o. male who presents today for a complete physical exam. He reports consuming a general diet.  He stays active, he is going to state for tennis in the near future.  He generally feels well. He reports sleeping poorly.  He wakes up several times a night, typically around 7-8.  Denies trouble falling back to sleep.Marland Kitchen He has also noticed a tremor that he has developed, bilateral action tremor, similar to his father's tremor.   Most recent fall risk assessment:    05/17/2023    8:21 AM  Fall Risk   Falls in the past year? 0  Number falls in past yr: 0  Injury with Fall? 0  Risk for fall due to : No Fall Risks  Follow up Falls evaluation completed     Most recent depression and anxiety screenings:    05/17/2023    8:34 AM 10/02/2022    1:07 PM  PHQ 2/9 Scores  PHQ - 2 Score 1 0  PHQ- 9 Score 1       05/17/2023    8:34 AM 03/09/2022    1:17 PM 04/28/2021    1:23 PM 02/27/2019    1:48 PM  GAD 7 : Generalized Anxiety Score  Nervous, Anxious, on Edge 2 0 1 1  Control/stop worrying 1 0 0 1  Worry too much - different things 1 0 0 1  Trouble relaxing 0 0 0 0  Restless 0 0 0 0  Easily annoyed or irritable 0 0 0 0  Afraid - awful might happen 1 0 0 0  Total GAD 7 Score 5 0 1 3  Anxiety Difficulty  Not difficult at all  Somewhat difficult    Patient Active Problem List   Diagnosis Date Noted   Essential tremor 05/17/2023   Acute gouty arthropathy 10/12/2017   Vitamin D insufficiency 10/12/2017   Prediabetes 05/16/2016   Environmental and seasonal allergies 05/16/2016   Asthma 01/03/2016   Hyperprolactinemia (HCC) 08/20/2015   Hypogonadism male 05/18/2015   Eczema 03/24/2015   Essential hypertension 12/13/2012   Obesity, Class III, BMI 40-49.9 (morbid obesity) (HCC)  12/12/2012   Allergic rhinitis with postnasal drip 08/16/2012    History reviewed. No pertinent surgical history. Social History   Tobacco Use   Smoking status: Never    Passive exposure: Never   Smokeless tobacco: Never  Vaping Use   Vaping status: Never Used  Substance Use Topics   Alcohol use: Yes    Alcohol/week: 1.0 standard drink of alcohol    Types: 1 Cans of beer per week   Drug use: No   Family History  Problem Relation Age of Onset   Hypertension Mother    Hyperlipidemia Mother    Hypertension Father    Hyperlipidemia Father    Colon polyps Father    Colon cancer Father 52   Stroke Father 72   Hypertension Brother    Heart disease Maternal Grandfather    Hyperlipidemia Maternal Grandfather    Hypertension Maternal Grandfather    Hypertension Paternal Grandmother    Stroke Paternal Grandmother    Heart disease Paternal Grandfather    Hyperlipidemia Paternal Grandfather    Hypertension Paternal Grandfather    Esophageal cancer Neg Hx  Stomach cancer Neg Hx    Rectal cancer Neg Hx    Allergies  Allergen Reactions   Bee Venom      Patient Care Team: Melida Quitter, PA as PCP - General (Family Medicine) Dorisann Frames, MD as Consulting Physician (Endocrinology) Jethro Bolus, MD (Inactive) as Consulting Physician (Urology)   Outpatient Medications Prior to Visit  Medication Sig   allopurinol (ZYLOPRIM) 100 MG tablet Take 1 tablet (100 mg total) by mouth daily.   buPROPion (WELLBUTRIN SR) 150 MG 12 hr tablet Take 150 mg by mouth daily.   cabergoline (DOSTINEX) 0.5 MG tablet Take 0.5 mg by mouth 2 (two) times a week.   clomiPHENE (CLOMID) 50 MG tablet Take 50 mg by mouth daily.   clonazePAM (KLONOPIN) 0.5 MG tablet Take 0.5 mg by mouth daily as needed.   colchicine 0.6 MG tablet 1.2 mg p.o. X 1  Then 0.6 mg p.o. 1 hour later x 1 at first onset of gouty flare.   losartan (COZAAR) 50 MG tablet Take 1 tablet (50 mg total) by mouth daily.    meloxicam (MOBIC) 15 MG tablet Take 1 tablet (15 mg total) by mouth as needed. One tablet daily.   montelukast (SINGULAIR) 10 MG tablet Take 1 tablet (10 mg total) by mouth at bedtime.   oxymetazoline (AFRIN) 0.05 % nasal spray    tadalafil (CIALIS) 5 MG tablet Take 5 mg by mouth daily as needed.   TRULICITY 1.5 MG/0.5ML SOPN Inject 1.5 mg into the skin once a week.   No facility-administered medications prior to visit.    Review of Systems  Constitutional:  Negative for chills, fever and malaise/fatigue.  HENT:  Negative for congestion and hearing loss.   Eyes:  Negative for blurred vision and double vision.  Respiratory:  Negative for cough and shortness of breath.   Cardiovascular:  Negative for chest pain, palpitations and leg swelling.  Gastrointestinal:  Negative for abdominal pain, constipation, diarrhea and heartburn.  Genitourinary:  Negative for frequency and urgency.  Musculoskeletal:  Negative for myalgias and neck pain.  Neurological:  Positive for tremors. Negative for headaches.  Endo/Heme/Allergies:  Negative for polydipsia.  Psychiatric/Behavioral:  Negative for depression. The patient has insomnia. The patient is not nervous/anxious.       Objective:    BP 135/88 (BP Location: Left Arm, Patient Position: Sitting, Cuff Size: Normal)   Pulse 82   Resp 18   Ht 6\' 1"  (1.854 m)   Wt 248 lb (112.5 kg)   SpO2 100%   BMI 32.72 kg/m    Physical Exam Constitutional:      General: He is not in acute distress.    Appearance: Normal appearance.  HENT:     Head: Normocephalic and atraumatic.     Right Ear: Tympanic membrane, ear canal and external ear normal. There is no impacted cerumen.     Left Ear: Tympanic membrane, ear canal and external ear normal. There is no impacted cerumen.     Nose: Nose normal.     Mouth/Throat:     Mouth: Mucous membranes are moist.     Pharynx: Oropharynx is clear. No posterior oropharyngeal erythema.  Eyes:     Extraocular  Movements: Extraocular movements intact.     Conjunctiva/sclera: Conjunctivae normal.     Pupils: Pupils are equal, round, and reactive to light.  Neck:     Thyroid: No thyroid mass, thyromegaly or thyroid tenderness.  Cardiovascular:     Rate and Rhythm: Normal rate  and regular rhythm.     Heart sounds: Normal heart sounds. No murmur heard.    No friction rub. No gallop.  Pulmonary:     Effort: Pulmonary effort is normal. No respiratory distress.     Breath sounds: Normal breath sounds. No stridor. No wheezing or rales.  Abdominal:     General: Bowel sounds are normal.     Palpations: Abdomen is soft. There is no mass.     Tenderness: There is no abdominal tenderness.  Musculoskeletal:        General: Normal range of motion.     Cervical back: Normal range of motion and neck supple.  Lymphadenopathy:     Cervical: No cervical adenopathy.  Skin:    General: Skin is warm and dry.  Neurological:     Mental Status: He is alert and oriented to person, place, and time.     Cranial Nerves: No cranial nerve deficit.     Motor: No weakness.     Deep Tendon Reflexes: Reflexes normal.  Psychiatric:        Mood and Affect: Mood normal.       Assessment & Plan:    Routine Health Maintenance and Physical Exam  Immunization History  Administered Date(s) Administered   Influenza Whole 08/02/2012   Influenza, Quadrivalent, Recombinant, Inj, Pf 06/12/2018, 06/14/2020   Influenza, Seasonal, Injecte, Preservative Fre 05/24/2023   Influenza,inj,Quad PF,6+ Mos 05/16/2017, 05/26/2019, 05/19/2021   Influenza-Unspecified 05/18/2014, 05/13/2015, 05/26/2019, 05/08/2022   Moderna SARS-COV2 Booster Vaccination 04/15/2021   PFIZER(Purple Top)SARS-COV-2 Vaccination 10/17/2019, 11/12/2019, 07/09/2020   Tdap 12/30/2010, 05/24/2023    Health Maintenance  Topic Date Due   Hepatitis C Screening  Never done   Zoster Vaccines- Shingrix (1 of 2) Never done   COVID-19 Vaccine (4 - 2023-24 season)  06/09/2023 (Originally 04/01/2023)   Colonoscopy  04/27/2030   DTaP/Tdap/Td (3 - Td or Tdap) 05/23/2033   INFLUENZA VACCINE  Completed   HIV Screening  Completed   HPV VACCINES  Aged Out    Reviewed most recent labs including CBC, CMP, lipid panel, A1C, TSH, and vitamin D. All within normal limits/stable from last check including prolactin.  Discussed health benefits of physical activity, and encouraged him to engage in regular exercise appropriate for his age and condition.  Wellness examination  Essential hypertension Assessment & Plan: BP goal <140/90. Stable, at goal.  Continue losartan 50 mg daily.  CMP within normal limits.  Continue ambulatory blood pressure monitoring.  Will continue to monitor.  Orders: -     CBC with Differential/Platelet; Future -     Comprehensive metabolic panel; Future  Need for influenza vaccination -     Flu vaccine trivalent PF, 6mos and older(Flulaval,Afluria,Fluarix,Fluzone)  Screening for viral disease -     Hepatitis C antibody; Future  Need for Tdap vaccination -     Tdap vaccine greater than or equal to 7yo IM  Essential tremor Assessment & Plan: Discussed tremor is likely benign essential tremor.  Option to start propranolol for tremor and additional benefit of potentially lowering blood pressure.  Patient would like to hold off for now, but may start in the future if needed.   Insomnia, unspecified type -     Home sleep test; Future  Home sleep test to evaluate frequent nocturnal awakenings.  Return in about 6 months (around 11/22/2023) for follow-up for HTN, nonfasting labs 1 week before.     Melida Quitter, PA

## 2023-06-07 ENCOUNTER — Encounter: Payer: 59 | Admitting: Family Medicine

## 2023-08-28 ENCOUNTER — Ambulatory Visit (HOSPITAL_BASED_OUTPATIENT_CLINIC_OR_DEPARTMENT_OTHER)
Admission: RE | Admit: 2023-08-28 | Discharge: 2023-08-28 | Disposition: A | Discharge status: Home / Self Care | Payer: 59 | Source: Ambulatory Visit | Attending: Physician Assistant | Admitting: Physician Assistant

## 2023-08-28 ENCOUNTER — Other Ambulatory Visit (HOSPITAL_BASED_OUTPATIENT_CLINIC_OR_DEPARTMENT_OTHER): Payer: Self-pay

## 2023-08-28 ENCOUNTER — Encounter (HOSPITAL_BASED_OUTPATIENT_CLINIC_OR_DEPARTMENT_OTHER): Payer: Self-pay

## 2023-08-28 VITALS — BP 149/93 | HR 74 | Temp 98.3°F | Resp 20

## 2023-08-28 DIAGNOSIS — J111 Influenza due to unidentified influenza virus with other respiratory manifestations: Secondary | ICD-10-CM

## 2023-08-28 DIAGNOSIS — Z20828 Contact with and (suspected) exposure to other viral communicable diseases: Secondary | ICD-10-CM | POA: Diagnosis not present

## 2023-08-28 LAB — POC COVID19/FLU A&B COMBO
Covid Antigen, POC: NEGATIVE
Influenza A Antigen, POC: NEGATIVE
Influenza B Antigen, POC: NEGATIVE

## 2023-08-28 MED ORDER — HYDROCODONE BIT-HOMATROP MBR 5-1.5 MG/5ML PO SOLN: 5.0000 mL | Freq: Two times a day (BID) | ORAL | 0 refills | Status: AC | PRN

## 2023-08-28 MED ORDER — OSELTAMIVIR PHOSPHATE 75 MG PO CAPS
75.0000 mg | ORAL_CAPSULE | Freq: Two times a day (BID) | ORAL | 0 refills | Status: DC
  Filled 2023-08-28: qty 10, 5d supply, fill #0

## 2023-08-28 MED ORDER — OSELTAMIVIR PHOSPHATE 75 MG PO CAPS: 75.0000 mg | ORAL_CAPSULE | Freq: Two times a day (BID) | ORAL | 0 refills | Status: AC

## 2023-08-28 MED ORDER — ALBUTEROL SULFATE HFA 108 (90 BASE) MCG/ACT IN AERS
1.0000 | INHALATION_SPRAY | Freq: Four times a day (QID) | RESPIRATORY_TRACT | 0 refills | Status: AC | PRN
Start: 2023-08-28 — End: ?

## 2023-08-28 MED ORDER — ALBUTEROL SULFATE HFA 108 (90 BASE) MCG/ACT IN AERS
1.0000 | INHALATION_SPRAY | Freq: Four times a day (QID) | RESPIRATORY_TRACT | 0 refills | Status: DC | PRN
  Filled 2023-08-28: qty 6.7, 17d supply, fill #0

## 2023-08-28 MED ORDER — HYDROCODONE BIT-HOMATROP MBR 5-1.5 MG/5ML PO SOLN
5.0000 mL | Freq: Two times a day (BID) | ORAL | 0 refills | Status: DC | PRN
  Filled 2023-08-28: qty 50, 5d supply, fill #0
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
## 2023-08-28 NOTE — ED Triage Notes (Signed)
Spouse was tested yesterday and tested positive for Influenza A. Patient with cough, body aches.

## 2023-08-28 NOTE — Discharge Instructions (Signed)
You tested negative in clinic but I suspect this is just because it is early in your illness.  Start Tamiflu twice daily for 5 days.  Use albuterol every 4-6 hours as needed for shortness of breath.  I have called in Hycodan to help with your cough.  This will make you sleepy so do not drive drink alcohol taking this medication.  It is sedating and addictive so limit use is much as possible.  You can use over-the-counter medication for symptom management.  If anything worsens or changes and you have worsening cough, shortness of breath, high fever, chest pain, nausea, vomiting you need to be seen immediately.  If symptoms are not improving within a week please return for reevaluation.

## 2023-09-05 ENCOUNTER — Other Ambulatory Visit (HOSPITAL_BASED_OUTPATIENT_CLINIC_OR_DEPARTMENT_OTHER): Payer: Self-pay

## 2023-09-06 ENCOUNTER — Encounter: Payer: Self-pay | Admitting: Family Medicine

## 2023-11-15 ENCOUNTER — Other Ambulatory Visit: Payer: 59

## 2023-11-15 DIAGNOSIS — I10 Essential (primary) hypertension: Secondary | ICD-10-CM

## 2023-11-15 DIAGNOSIS — Z1159 Encounter for screening for other viral diseases: Secondary | ICD-10-CM

## 2023-11-16 LAB — CBC WITH DIFFERENTIAL/PLATELET
Basophils Absolute: 0.1 10*3/uL (ref 0.0–0.2)
Basos: 1 %
EOS (ABSOLUTE): 0.2 10*3/uL (ref 0.0–0.4)
Eos: 3 %
Hematocrit: 41.2 % (ref 37.5–51.0)
Hemoglobin: 13.5 g/dL (ref 13.0–17.7)
Immature Grans (Abs): 0 10*3/uL (ref 0.0–0.1)
Immature Granulocytes: 0 %
Lymphocytes Absolute: 2 10*3/uL (ref 0.7–3.1)
Lymphs: 31 %
MCH: 29.5 pg (ref 26.6–33.0)
MCHC: 32.8 g/dL (ref 31.5–35.7)
MCV: 90 fL (ref 79–97)
Monocytes Absolute: 0.6 10*3/uL (ref 0.1–0.9)
Monocytes: 9 %
Neutrophils Absolute: 3.6 10*3/uL (ref 1.4–7.0)
Neutrophils: 56 %
Platelets: 261 10*3/uL (ref 150–450)
RBC: 4.57 x10E6/uL (ref 4.14–5.80)
RDW: 12.8 % (ref 11.6–15.4)
WBC: 6.5 10*3/uL (ref 3.4–10.8)

## 2023-11-16 LAB — COMPREHENSIVE METABOLIC PANEL WITH GFR
ALT: 16 IU/L (ref 0–44)
AST: 20 IU/L (ref 0–40)
Albumin: 4.3 g/dL (ref 3.8–4.9)
Alkaline Phosphatase: 53 IU/L (ref 44–121)
BUN/Creatinine Ratio: 11 (ref 9–20)
BUN: 11 mg/dL (ref 6–24)
Bilirubin Total: 0.6 mg/dL (ref 0.0–1.2)
CO2: 24 mmol/L (ref 20–29)
Calcium: 9.1 mg/dL (ref 8.7–10.2)
Chloride: 102 mmol/L (ref 96–106)
Creatinine, Ser: 0.98 mg/dL (ref 0.76–1.27)
Globulin, Total: 2.1 g/dL (ref 1.5–4.5)
Glucose: 80 mg/dL (ref 70–99)
Potassium: 4.6 mmol/L (ref 3.5–5.2)
Sodium: 142 mmol/L (ref 134–144)
Total Protein: 6.4 g/dL (ref 6.0–8.5)
eGFR: 92 mL/min/{1.73_m2} (ref >59)

## 2023-11-16 LAB — HEPATITIS C ANTIBODY: Hep C Virus Ab: NONREACTIVE

## 2023-11-22 ENCOUNTER — Ambulatory Visit: Payer: 59 | Admitting: Family Medicine

## 2023-11-30 ENCOUNTER — Ambulatory Visit: Admitting: Family Medicine

## 2023-12-05 ENCOUNTER — Encounter (HOSPITAL_COMMUNITY): Payer: Self-pay

## 2023-12-12 ENCOUNTER — Ambulatory Visit (HOSPITAL_BASED_OUTPATIENT_CLINIC_OR_DEPARTMENT_OTHER): Admitting: Family Medicine

## 2023-12-12 ENCOUNTER — Other Ambulatory Visit (HOSPITAL_BASED_OUTPATIENT_CLINIC_OR_DEPARTMENT_OTHER): Payer: Self-pay

## 2023-12-12 ENCOUNTER — Encounter (HOSPITAL_BASED_OUTPATIENT_CLINIC_OR_DEPARTMENT_OTHER): Payer: Self-pay | Admitting: Family Medicine

## 2023-12-12 VITALS — BP 142/93 | HR 88 | Temp 97.8°F | Resp 16 | Ht 74.41 in | Wt 234.8 lb

## 2023-12-12 DIAGNOSIS — I10 Essential (primary) hypertension: Secondary | ICD-10-CM

## 2023-12-12 DIAGNOSIS — N529 Male erectile dysfunction, unspecified: Secondary | ICD-10-CM

## 2023-12-12 DIAGNOSIS — E291 Testicular hypofunction: Secondary | ICD-10-CM

## 2023-12-12 DIAGNOSIS — G250 Essential tremor: Secondary | ICD-10-CM

## 2023-12-12 MED ORDER — BUPROPION HCL ER (XL) 300 MG PO TB24
300.0000 mg | ORAL_TABLET | Freq: Every day | ORAL | 2 refills | Status: AC
  Filled 2023-12-12: qty 30, 30d supply, fill #0

## 2023-12-12 MED ORDER — TADALAFIL 5 MG PO TABS: 10.0000 mg | ORAL_TABLET | Freq: Every day | ORAL | 3 refills | Status: DC | PRN

## 2023-12-12 MED ORDER — LOSARTAN POTASSIUM 50 MG PO TABS
50.0000 mg | ORAL_TABLET | Freq: Every day | ORAL | 0 refills | Status: DC
  Filled 2023-12-12: qty 30, 30d supply, fill #0
  Filled 2024-01-14: qty 30, 30d supply, fill #1
  Filled 2024-02-18: qty 30, 30d supply, fill #2

## 2023-12-12 MED ORDER — TADALAFIL 5 MG PO TABS
2.5000 mg | ORAL_TABLET | ORAL | 6 refills | Status: DC | PRN
  Filled 2023-12-12: qty 30, 30d supply, fill #0
  Filled 2024-01-14: qty 30, 30d supply, fill #1

## 2023-12-12 MED ORDER — WEGOVY 1.7 MG/0.75ML ~~LOC~~ SOAJ
1.7000 mg | SUBCUTANEOUS | 6 refills | Status: AC
Start: 2023-09-05 — End: ?
  Filled 2023-12-12: qty 3, 28d supply, fill #0
  Filled 2024-01-14: qty 3, 28d supply, fill #1
  Filled 2024-02-18: qty 3, 28d supply, fill #2
  Filled 2024-03-17: qty 3, 28d supply, fill #3
  Filled 2024-04-18 (×2): qty 3, 28d supply, fill #4

## 2023-12-12 MED ORDER — WEGOVY 1.7 MG/0.75ML ~~LOC~~ SOAJ
1.7000 mg | SUBCUTANEOUS | 5 refills | Status: AC
  Filled 2024-04-03: qty 3, 28d supply, fill #0

## 2023-12-12 MED ORDER — TRULICITY 1.5 MG/0.5ML ~~LOC~~ SOAJ: 1.5000 mg | SUBCUTANEOUS | 6 refills | Status: AC

## 2023-12-12 MED ORDER — CABERGOLINE 0.5 MG PO TABS
0.5000 mg | ORAL_TABLET | ORAL | 5 refills | Status: DC
  Filled 2023-12-12: qty 8, 28d supply, fill #0
  Filled 2024-01-14 – 2024-02-18 (×2): qty 8, 28d supply, fill #1

## 2023-12-12 MED ORDER — CLOMIPHENE CITRATE 50 MG PO TABS
50.0000 mg | ORAL_TABLET | Freq: Every day | ORAL | 0 refills | Status: DC
Start: 2023-04-16 — End: 2024-05-26
  Filled 2023-12-12: qty 30, 30d supply, fill #0

## 2023-12-12 MED ORDER — BUPROPION HCL ER (XL) 300 MG PO TB24
300.0000 mg | ORAL_TABLET | Freq: Every day | ORAL | 0 refills | Status: DC
  Filled 2023-12-12: qty 30, 30d supply, fill #0

## 2023-12-12 MED ORDER — HYDROXYZINE HCL 25 MG PO TABS
25.0000 mg | ORAL_TABLET | Freq: Two times a day (BID) | ORAL | 1 refills | Status: DC | PRN
  Filled 2023-12-12 – 2023-12-14 (×2): qty 60, 30d supply, fill #0
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
## 2023-12-12 NOTE — Assessment & Plan Note (Addendum)
 Overall doing well.  Promptly start reviewing his history.  Monitor his likely essential tremor.  F/u in November for his CPE or sooner prn.

## 2023-12-13 ENCOUNTER — Other Ambulatory Visit (HOSPITAL_BASED_OUTPATIENT_CLINIC_OR_DEPARTMENT_OTHER): Payer: Self-pay

## 2023-12-14 ENCOUNTER — Other Ambulatory Visit (HOSPITAL_BASED_OUTPATIENT_CLINIC_OR_DEPARTMENT_OTHER): Payer: Self-pay

## 2024-01-01 ENCOUNTER — Other Ambulatory Visit (HOSPITAL_BASED_OUTPATIENT_CLINIC_OR_DEPARTMENT_OTHER): Payer: Self-pay

## 2024-01-01 MED ORDER — CLONAZEPAM 0.5 MG PO TABS
0.5000 mg | ORAL_TABLET | Freq: Every day | ORAL | 0 refills | Status: DC | PRN
  Filled 2024-01-01: qty 20, 20d supply, fill #0

## 2024-01-01 MED ORDER — BUPROPION HCL ER (XL) 300 MG PO TB24
300.0000 mg | ORAL_TABLET | Freq: Every day | ORAL | 2 refills | Status: DC
  Filled 2024-01-01 – 2024-01-07 (×2): qty 30, 30d supply, fill #0
  Filled 2024-02-18: qty 30, 30d supply, fill #1
  Filled 2024-03-17: qty 30, 30d supply, fill #2

## 2024-01-01 MED ORDER — HYDROXYZINE HCL 25 MG PO TABS
25.0000 mg | ORAL_TABLET | Freq: Two times a day (BID) | ORAL | 2 refills | Status: DC | PRN
  Filled 2024-01-01 – 2024-01-07 (×2): qty 60, 30d supply, fill #0
  Filled 2024-02-18: qty 60, 30d supply, fill #1
  Filled 2024-03-17: qty 60, 30d supply, fill #2

## 2024-01-02 ENCOUNTER — Other Ambulatory Visit (HOSPITAL_BASED_OUTPATIENT_CLINIC_OR_DEPARTMENT_OTHER): Payer: Self-pay

## 2024-01-03 ENCOUNTER — Other Ambulatory Visit (HOSPITAL_BASED_OUTPATIENT_CLINIC_OR_DEPARTMENT_OTHER): Payer: Self-pay

## 2024-01-07 ENCOUNTER — Other Ambulatory Visit (HOSPITAL_BASED_OUTPATIENT_CLINIC_OR_DEPARTMENT_OTHER): Payer: Self-pay

## 2024-01-14 ENCOUNTER — Other Ambulatory Visit (HOSPITAL_BASED_OUTPATIENT_CLINIC_OR_DEPARTMENT_OTHER): Payer: Self-pay

## 2024-01-15 ENCOUNTER — Other Ambulatory Visit (HOSPITAL_BASED_OUTPATIENT_CLINIC_OR_DEPARTMENT_OTHER): Payer: Self-pay

## 2024-01-28 ENCOUNTER — Other Ambulatory Visit (HOSPITAL_BASED_OUTPATIENT_CLINIC_OR_DEPARTMENT_OTHER): Payer: Self-pay

## 2024-01-30 ENCOUNTER — Other Ambulatory Visit (HOSPITAL_BASED_OUTPATIENT_CLINIC_OR_DEPARTMENT_OTHER): Payer: Self-pay

## 2024-02-18 ENCOUNTER — Other Ambulatory Visit (HOSPITAL_BASED_OUTPATIENT_CLINIC_OR_DEPARTMENT_OTHER): Payer: Self-pay

## 2024-03-12 ENCOUNTER — Other Ambulatory Visit: Payer: Self-pay | Admitting: Family Medicine

## 2024-03-13 ENCOUNTER — Other Ambulatory Visit (HOSPITAL_BASED_OUTPATIENT_CLINIC_OR_DEPARTMENT_OTHER): Payer: Self-pay

## 2024-03-13 ENCOUNTER — Other Ambulatory Visit (HOSPITAL_BASED_OUTPATIENT_CLINIC_OR_DEPARTMENT_OTHER): Payer: Self-pay | Admitting: Family Medicine

## 2024-03-13 MED ORDER — LOSARTAN POTASSIUM 50 MG PO TABS
50.0000 mg | ORAL_TABLET | Freq: Every day | ORAL | 0 refills | Status: DC
  Filled 2024-03-13: qty 30, 30d supply, fill #0
  Filled 2024-04-18: qty 30, 30d supply, fill #1
  Filled 2024-05-06 – 2024-05-12 (×2): qty 30, 30d supply, fill #2

## 2024-03-17 ENCOUNTER — Other Ambulatory Visit (HOSPITAL_BASED_OUTPATIENT_CLINIC_OR_DEPARTMENT_OTHER): Payer: Self-pay

## 2024-03-17 ENCOUNTER — Other Ambulatory Visit: Payer: Self-pay

## 2024-03-18 ENCOUNTER — Other Ambulatory Visit (HOSPITAL_BASED_OUTPATIENT_CLINIC_OR_DEPARTMENT_OTHER): Payer: Self-pay

## 2024-03-18 MED ORDER — CABERGOLINE 0.5 MG PO TABS
ORAL_TABLET | ORAL | 5 refills | Status: DC
  Filled 2024-03-18: qty 8, 28d supply, fill #0
  Filled 2024-04-18: qty 8, 28d supply, fill #1
  Filled 2024-05-06 – 2024-05-12 (×2): qty 8, 28d supply, fill #2

## 2024-03-19 ENCOUNTER — Other Ambulatory Visit (HOSPITAL_BASED_OUTPATIENT_CLINIC_OR_DEPARTMENT_OTHER): Payer: Self-pay

## 2024-03-20 ENCOUNTER — Other Ambulatory Visit (HOSPITAL_BASED_OUTPATIENT_CLINIC_OR_DEPARTMENT_OTHER): Payer: Self-pay

## 2024-03-24 ENCOUNTER — Other Ambulatory Visit (HOSPITAL_BASED_OUTPATIENT_CLINIC_OR_DEPARTMENT_OTHER): Payer: Self-pay

## 2024-03-24 MED ORDER — TADALAFIL 5 MG PO TABS
5.0000 mg | ORAL_TABLET | ORAL | 5 refills | Status: DC | PRN
  Filled 2024-03-24 – 2024-05-06 (×2): qty 30, 7d supply, fill #0

## 2024-04-03 ENCOUNTER — Other Ambulatory Visit (HOSPITAL_BASED_OUTPATIENT_CLINIC_OR_DEPARTMENT_OTHER): Payer: Self-pay

## 2024-04-07 ENCOUNTER — Other Ambulatory Visit (HOSPITAL_BASED_OUTPATIENT_CLINIC_OR_DEPARTMENT_OTHER): Payer: Self-pay

## 2024-04-18 ENCOUNTER — Other Ambulatory Visit (HOSPITAL_BASED_OUTPATIENT_CLINIC_OR_DEPARTMENT_OTHER): Payer: Self-pay

## 2024-04-18 ENCOUNTER — Other Ambulatory Visit (HOSPITAL_BASED_OUTPATIENT_CLINIC_OR_DEPARTMENT_OTHER): Payer: Self-pay | Admitting: Family Medicine

## 2024-04-18 ENCOUNTER — Encounter (HOSPITAL_BASED_OUTPATIENT_CLINIC_OR_DEPARTMENT_OTHER): Payer: Self-pay | Admitting: Pharmacy Technician

## 2024-04-18 DIAGNOSIS — N529 Male erectile dysfunction, unspecified: Secondary | ICD-10-CM

## 2024-04-23 ENCOUNTER — Other Ambulatory Visit (HOSPITAL_BASED_OUTPATIENT_CLINIC_OR_DEPARTMENT_OTHER): Payer: Self-pay

## 2024-04-23 MED ORDER — CLONAZEPAM 0.5 MG PO TABS
0.5000 mg | ORAL_TABLET | Freq: Every day | ORAL | 0 refills | Status: DC | PRN
  Filled 2024-04-23: qty 20, 20d supply, fill #0

## 2024-04-23 MED ORDER — BUPROPION HCL ER (XL) 300 MG PO TB24
300.0000 mg | ORAL_TABLET | Freq: Every day | ORAL | 2 refills | Status: AC
  Filled 2024-04-23: qty 30, 30d supply, fill #0
  Filled 2024-06-19: qty 30, 30d supply, fill #1

## 2024-04-23 MED ORDER — HYDROXYZINE HCL 25 MG PO TABS
25.0000 mg | ORAL_TABLET | Freq: Two times a day (BID) | ORAL | 2 refills | Status: AC | PRN
  Filled 2024-04-23: qty 60, 30d supply, fill #0
  Filled 2024-06-19: qty 60, 30d supply, fill #1

## 2024-05-06 ENCOUNTER — Other Ambulatory Visit (HOSPITAL_BASED_OUTPATIENT_CLINIC_OR_DEPARTMENT_OTHER): Payer: Self-pay

## 2024-05-12 ENCOUNTER — Other Ambulatory Visit (HOSPITAL_BASED_OUTPATIENT_CLINIC_OR_DEPARTMENT_OTHER): Payer: Self-pay

## 2024-05-26 ENCOUNTER — Ambulatory Visit (INDEPENDENT_AMBULATORY_CARE_PROVIDER_SITE_OTHER): Admitting: Family Medicine

## 2024-05-26 ENCOUNTER — Encounter (HOSPITAL_BASED_OUTPATIENT_CLINIC_OR_DEPARTMENT_OTHER): Payer: Self-pay | Admitting: Family Medicine

## 2024-05-26 VITALS — BP 139/93 | HR 73 | Ht 73.0 in | Wt 237.1 lb

## 2024-05-26 DIAGNOSIS — Z23 Encounter for immunization: Secondary | ICD-10-CM | POA: Diagnosis not present

## 2024-05-26 DIAGNOSIS — Z0000 Encounter for general adult medical examination without abnormal findings: Secondary | ICD-10-CM

## 2024-05-26 DIAGNOSIS — S86002A Unspecified injury of left Achilles tendon, initial encounter: Secondary | ICD-10-CM

## 2024-05-26 NOTE — Progress Notes (Signed)
 Established Patient Office Visit  Subjective   Patient ID: David Aguilar, male    DOB: 09-07-1970  Age: 53 y.o. MRN: [id number]  Chief Complaint  Patient presents with   Annual Exam    F/u as above.  Overall doing well with few concerns.  He has struggled with a recent Left achilles injury.  Although its improving, he requests Ortho referral.  Extended discussion about weight loss today.  DASH diet reviewed.  I encouraged low impact swimming as tolerated.  He admits to drinking at least one cocktail nightly, and I strongly encouraged him to reduce or stop this habit.  He states his exercise capacity is good.  Recent Lipids looked good.    Past Medical History:  Diagnosis Date   Allergy    Anxiety    f/by Psych   Hyperlipidemia    Hypertension    Neoplasm of pituitary gland 07/21/2015   f/by Dr. Tommas.  Secondary Hypotestosterone, etc.   Obesity     Outpatient Encounter Medications as of 05/26/2024  Medication Sig   buPROPion  (WELLBUTRIN  XL) 300 MG 24 hr tablet Take 1 tablet (300 mg total) by mouth daily.   cabergoline  (DOSTINEX ) 0.5 MG tablet Take 0.5 mg by mouth 2 (two) times a week.   clomiPHENE  (CLOMID ) 50 MG tablet Take 50 mg by mouth daily.   clonazePAM  (KLONOPIN ) 0.5 MG tablet Take 0.5 mg by mouth daily as needed.   hydrOXYzine  (ATARAX ) 25 MG tablet Take 1 tablet (25 mg total) by mouth 2 (two) times daily as needed for anxiety and insomnia   losartan  (COZAAR ) 50 MG tablet Take 1 tablet (50 mg total) by mouth daily.   tadalafil  (CIALIS ) 5 MG tablet Take 2 tablets (10 mg total) by mouth daily as needed (Max of 20mg  in 36 hours).   [DISCONTINUED] buPROPion  (WELLBUTRIN  XL) 300 MG 24 hr tablet Take 1 tablet (300 mg total) by mouth daily.   [DISCONTINUED] buPROPion  (WELLBUTRIN  XL) 300 MG 24 hr tablet Take 1 tablet (300 mg total) by mouth daily.   [DISCONTINUED] oxymetazoline (AFRIN) 0.05 % nasal spray    albuterol  (VENTOLIN  HFA) 108 (90 Base) MCG/ACT inhaler Inhale 1-2  puffs into the lungs every 6 (six) hours as needed for wheezing or shortness of breath. (Patient not taking: Reported on 05/26/2024)   allopurinol  (ZYLOPRIM ) 100 MG tablet Take 1 tablet (100 mg total) by mouth daily. (Patient not taking: Reported on 05/26/2024)   buPROPion  (WELLBUTRIN  SR) 150 MG 12 hr tablet Take 150 mg by mouth daily. (Patient not taking: Reported on 05/26/2024)   colchicine  0.6 MG tablet 1.2 mg p.o. X 1  Then 0.6 mg p.o. 1 hour later x 1 at first onset of gouty flare. (Patient not taking: Reported on 05/26/2024)   Dulaglutide  (TRULICITY ) 1.5 MG/0.5ML SOAJ Inject 1.5 mg into the skin once a week. (Patient not taking: Reported on 05/26/2024)   meloxicam  (MOBIC ) 15 MG tablet Take 1 tablet (15 mg total) by mouth as needed. One tablet daily. (Patient not taking: Reported on 05/26/2024)   montelukast  (SINGULAIR ) 10 MG tablet Take 1 tablet (10 mg total) by mouth at bedtime. (Patient not taking: Reported on 05/26/2024)   oseltamivir  (TAMIFLU ) 75 MG capsule Take 1 capsule (75 mg total) by mouth every 12 (twelve) hours. (Patient not taking: Reported on 05/26/2024)   semaglutide -weight management (WEGOVY ) 1.7 MG/0.75ML SOAJ SQ injection Inject 1.7 mg into the skin once a week. (Patient not taking: Reported on 05/26/2024)   semaglutide -weight management (WEGOVY ) 1.7  MG/0.75ML SOAJ SQ injection Inject 1.7 mg into the skin once a week.   TRULICITY  1.5 MG/0.5ML SOPN Inject 1.5 mg into the skin once a week. (Patient not taking: Reported on 05/26/2024)   WEGOVY  1.7 MG/0.75ML SOAJ SMARTSIG:1.7 Milligram(s) SUB-Q Once a Week (Patient not taking: Reported on 05/26/2024)   [DISCONTINUED] cabergoline  (DOSTINEX ) 0.5 MG tablet Take 1 tablet (0.5 mg total) by mouth 2 (two) times a week.   [DISCONTINUED] clomiPHENE  (CLOMID ) 50 MG tablet Take 1 tablet (50 mg total) by mouth daily. Please call office for appointment   [DISCONTINUED] clonazePAM  (KLONOPIN ) 0.5 MG tablet Take 1 tablet (0.5 mg total) by mouth daily  as needed for anxiety symptoms   [DISCONTINUED] clonazePAM  (KLONOPIN ) 0.5 MG tablet Take 1 tablet (0.5 mg total) by mouth daily as needed for anxiety symptoms   [DISCONTINUED] losartan  (COZAAR ) 50 MG tablet Take 1 tablet (50 mg total) by mouth daily.   [DISCONTINUED] tadalafil  (CIALIS ) 5 MG tablet Take 0.5-2 tablets (2.5-10 mg total) by mouth as needed 1 hour prior to anticipated activities, on an empty stomach   [DISCONTINUED] tadalafil  (CIALIS ) 5 MG tablet Take 1-4 tablets (5-20 mg total) by mouth as needed 1 hour prior to intercourse   No facility-administered encounter medications on file as of 05/26/2024.    Social History   Tobacco Use   Smoking status: Never    Passive exposure: Never   Smokeless tobacco: Never  Vaping Use   Vaping status: Never Used  Substance Use Topics   Alcohol use: Yes    Alcohol/week: 1.0 standard drink of alcohol    Types: 1 Cans of beer per week    Comment: Moderate whiskey consumption.  Cautioned.   Drug use: No   Family History  Problem Relation Age of Onset   Hypertension Mother    Hyperlipidemia Mother    Hypertension Father    Hyperlipidemia Father    Colon polyps Father    Colon cancer Father 62   Stroke Father 21   Hypertension Brother    Heart disease Maternal Grandfather    Hyperlipidemia Maternal Grandfather    Hypertension Maternal Grandfather    Hypertension Paternal Grandmother    Stroke Paternal Grandmother    Heart disease Paternal Grandfather    Hyperlipidemia Paternal Grandfather    Hypertension Paternal Grandfather    Esophageal cancer Neg Hx    Stomach cancer Neg Hx    Rectal cancer Neg Hx       Review of Systems  Constitutional: Negative.   HENT: Negative.    Eyes: Negative.   Respiratory: Negative.    Cardiovascular: Negative.   Gastrointestinal: Negative.   Genitourinary: Negative.   Musculoskeletal: Negative.   Skin: Negative.   Neurological: Negative.   Endo/Heme/Allergies: Negative.    Psychiatric/Behavioral: Negative.        Objective:     BP (!) 139/93   Pulse 73   Ht 6' 1 (1.854 m)   Wt 237 lb 1.6 oz (107.5 kg)   SpO2 98%   BMI 31.28 kg/m    Physical Exam Constitutional:      General: He is not in acute distress.    Appearance: Normal appearance. He is obese.  HENT:     Head: Normocephalic.     Comments: Sees eye doctor and dentist    Right Ear: Tympanic membrane normal.     Left Ear: Tympanic membrane normal.     Nose: Nose normal.     Mouth/Throat:     Pharynx: Oropharynx  is clear.  Eyes:     Extraocular Movements: Extraocular movements intact.     Pupils: Pupils are equal, round, and reactive to light.  Neck:     Thyroid : No thyroid  mass, thyromegaly or thyroid  tenderness.     Vascular: No carotid bruit.  Cardiovascular:     Rate and Rhythm: Normal rate and regular rhythm.     Pulses: Normal pulses.     Heart sounds: Normal heart sounds.  Pulmonary:     Breath sounds: Normal breath sounds.  Abdominal:     General: Bowel sounds are normal. There is no distension.     Palpations: Abdomen is soft. There is no mass.     Tenderness: There is no abdominal tenderness.     Hernia: No hernia is present.  Genitourinary:    Penis: Normal.      Testes: Normal.  Musculoskeletal:        General: No tenderness. Normal range of motion.     Cervical back: Normal range of motion and neck supple. No tenderness.     Right lower leg: No edema.     Left lower leg: No edema.  Lymphadenopathy:     Cervical: No cervical adenopathy.  Skin:    General: Skin is warm and dry.     Findings: No lesion.     Comments: Otherwise deferred to dermatology  Neurological:     General: No focal deficit present.     Mental Status: He is alert.  Psychiatric:        Mood and Affect: Mood normal.      No results found for any visits on 05/26/24.    The 10-year ASCVD risk score (Arnett DK, et al., 2019) is: 4.2%    Assessment & Plan:  Wellness  examination Assessment & Plan: Focus on reduction of alcohol consumption and weight loss.  Monitor BP in the coming weeks.  Reminded him to never combine alcohol with the Klonopin  provided by his Psychiatrist.  Await labs and f/u with him soon.  He declines the need to see a dietitian.  Orders: -     Lipid panel -     PSA  Achilles tendon injury, left, initial encounter -     Ambulatory referral to Orthopedic Surgery  Encounter for immunization -     Flu vaccine trivalent PF, 6mos and older(Flulaval,Afluria,Fluarix,Fluzone)    No follow-ups on file.    REDDING PONCE NORLEEN FALCON., MD

## 2024-05-26 NOTE — Assessment & Plan Note (Signed)
 Focus on reduction of alcohol consumption and weight loss.  Monitor BP in the coming weeks.  Reminded him to never combine alcohol with the Klonopin  provided by his Psychiatrist.  Await labs and f/u with him soon.  He declines the need to see a dietitian.

## 2024-05-29 ENCOUNTER — Encounter (HOSPITAL_BASED_OUTPATIENT_CLINIC_OR_DEPARTMENT_OTHER): Payer: Self-pay | Admitting: Family Medicine

## 2024-06-16 ENCOUNTER — Telehealth (HOSPITAL_BASED_OUTPATIENT_CLINIC_OR_DEPARTMENT_OTHER): Payer: Self-pay | Admitting: Family Medicine

## 2024-06-16 ENCOUNTER — Telehealth (HOSPITAL_BASED_OUTPATIENT_CLINIC_OR_DEPARTMENT_OTHER): Payer: Self-pay | Admitting: *Deleted

## 2024-06-16 NOTE — Telephone Encounter (Signed)
 Copied from CRM [id number]. Topic: Clinical - Lab/Test Results >> Jun 16, 2024  9:13 AM Laymon HERO wrote: Reason for CRM: Patient was seen on 10/27 for a physical- after the appointment he had labs done and blood was taken. There has been no update on the blood draw and lab results. It only says ordered. He said it was done in the room that he saw Dr Dottie in. Please reach out to patient as soon as possible.  No labs are showing in [facility] for the patients, please advise the patient on labs and if he needs to come in for them?

## 2024-06-17 NOTE — Telephone Encounter (Signed)
 Pt. Made aware.

## 2024-06-19 ENCOUNTER — Other Ambulatory Visit (HOSPITAL_BASED_OUTPATIENT_CLINIC_OR_DEPARTMENT_OTHER): Payer: Self-pay

## 2024-06-19 ENCOUNTER — Other Ambulatory Visit (HOSPITAL_BASED_OUTPATIENT_CLINIC_OR_DEPARTMENT_OTHER): Payer: Self-pay | Admitting: Family Medicine

## 2024-06-19 ENCOUNTER — Ambulatory Visit (HOSPITAL_BASED_OUTPATIENT_CLINIC_OR_DEPARTMENT_OTHER)

## 2024-06-19 MED ORDER — CABERGOLINE 0.5 MG PO TABS
0.5000 mg | ORAL_TABLET | ORAL | 5 refills | Status: AC
  Filled 2024-06-19 – 2024-07-16 (×2): qty 8, 28d supply, fill #0
  Filled 2024-08-12: qty 8, 28d supply, fill #1

## 2024-06-19 MED ORDER — LOSARTAN POTASSIUM 50 MG PO TABS
50.0000 mg | ORAL_TABLET | Freq: Every day | ORAL | 1 refills | Status: AC
  Filled 2024-06-19: qty 90, 90d supply, fill #0

## 2024-06-25 ENCOUNTER — Encounter (HOSPITAL_BASED_OUTPATIENT_CLINIC_OR_DEPARTMENT_OTHER): Payer: Self-pay | Admitting: Family Medicine

## 2024-07-03 ENCOUNTER — Other Ambulatory Visit (HOSPITAL_BASED_OUTPATIENT_CLINIC_OR_DEPARTMENT_OTHER): Payer: Self-pay

## 2024-07-04 ENCOUNTER — Telehealth (HOSPITAL_BASED_OUTPATIENT_CLINIC_OR_DEPARTMENT_OTHER): Payer: Self-pay | Admitting: *Deleted

## 2024-07-04 ENCOUNTER — Telehealth: Payer: Self-pay

## 2024-07-04 ENCOUNTER — Encounter (HOSPITAL_BASED_OUTPATIENT_CLINIC_OR_DEPARTMENT_OTHER): Payer: Self-pay | Admitting: *Deleted
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
## 2024-07-07 NOTE — Telephone Encounter (Signed)
 Spoke with the pt.

## 2024-07-14 ENCOUNTER — Other Ambulatory Visit (HOSPITAL_BASED_OUTPATIENT_CLINIC_OR_DEPARTMENT_OTHER): Payer: Self-pay

## 2024-07-14 MED ORDER — BUPROPION HCL ER (XL) 300 MG PO TB24
300.0000 mg | ORAL_TABLET | Freq: Every day | ORAL | 2 refills | Status: AC
  Filled 2024-07-14: qty 30, 30d supply, fill #0
  Filled 2024-08-12 (×2): qty 30, 30d supply, fill #1

## 2024-07-14 MED ORDER — CLONAZEPAM 0.5 MG PO TABS
0.5000 mg | ORAL_TABLET | Freq: Every day | ORAL | 0 refills | Status: AC | PRN
  Filled 2024-07-14: qty 20, 20d supply, fill #0

## 2024-07-14 MED ORDER — HYDROXYZINE HCL 25 MG PO TABS
25.0000 mg | ORAL_TABLET | Freq: Two times a day (BID) | ORAL | 2 refills | Status: AC | PRN
  Filled 2024-07-14: qty 60, 30d supply, fill #0
  Filled 2024-08-27: qty 60, 30d supply, fill #1

## 2024-07-14 MED ORDER — BUSPIRONE HCL 5 MG PO TABS
5.0000 mg | ORAL_TABLET | Freq: Two times a day (BID) | ORAL | 0 refills | Status: AC
  Filled 2024-07-14: qty 60, 30d supply, fill #0

## 2024-07-14 MED ORDER — BUSPIRONE HCL 5 MG PO TABS
5.0000 mg | ORAL_TABLET | Freq: Two times a day (BID) | ORAL | 0 refills | Status: AC
  Filled 2024-07-14 – 2024-08-27 (×2): qty 60, 30d supply, fill #0

## 2024-07-16 ENCOUNTER — Other Ambulatory Visit (HOSPITAL_BASED_OUTPATIENT_CLINIC_OR_DEPARTMENT_OTHER): Payer: Self-pay

## 2024-07-23 ENCOUNTER — Other Ambulatory Visit (HOSPITAL_BASED_OUTPATIENT_CLINIC_OR_DEPARTMENT_OTHER): Payer: Self-pay

## 2024-07-23 ENCOUNTER — Encounter (HOSPITAL_BASED_OUTPATIENT_CLINIC_OR_DEPARTMENT_OTHER): Payer: Self-pay | Admitting: Pharmacy Technician

## 2024-08-11 ENCOUNTER — Other Ambulatory Visit (HOSPITAL_BASED_OUTPATIENT_CLINIC_OR_DEPARTMENT_OTHER): Payer: Self-pay

## 2024-08-11 MED ORDER — BUSPIRONE HCL 7.5 MG PO TABS
7.5000 mg | ORAL_TABLET | Freq: Two times a day (BID) | ORAL | 0 refills | Status: AC
  Filled 2024-08-11: qty 60, 30d supply, fill #0

## 2024-08-12 ENCOUNTER — Other Ambulatory Visit (HOSPITAL_BASED_OUTPATIENT_CLINIC_OR_DEPARTMENT_OTHER): Payer: Self-pay

## 2024-08-27 ENCOUNTER — Encounter (HOSPITAL_BASED_OUTPATIENT_CLINIC_OR_DEPARTMENT_OTHER): Payer: Self-pay | Admitting: Pharmacist

## 2024-08-27 ENCOUNTER — Ambulatory Visit: Admitting: Podiatry

## 2024-08-27 ENCOUNTER — Other Ambulatory Visit (HOSPITAL_BASED_OUTPATIENT_CLINIC_OR_DEPARTMENT_OTHER): Payer: Self-pay

## 2024-08-27 ENCOUNTER — Ambulatory Visit (INDEPENDENT_AMBULATORY_CARE_PROVIDER_SITE_OTHER)

## 2024-08-27 DIAGNOSIS — S99922A Unspecified injury of left foot, initial encounter: Secondary | ICD-10-CM

## 2024-08-27 DIAGNOSIS — M76822 Posterior tibial tendinitis, left leg: Secondary | ICD-10-CM | POA: Diagnosis not present

## 2024-08-27 MED ORDER — MELOXICAM 15 MG PO TABS
15.0000 mg | ORAL_TABLET | ORAL | 1 refills | Status: AC | PRN
  Filled 2024-08-27: qty 30, 30d supply, fill #0

## 2024-08-27 NOTE — Progress Notes (Signed)
 "   Chief Complaint  Patient presents with   Foot Injury    Left foot injury while skiing, strained ankle and foot while tring to learn to turn. Happened Jan 6 or 7th.  Started wearing wifes cam walker 3 days ago and it has helped.  Not diabetic and no anti coag.    Discussed the use of AI scribe software for clinical note transcription with the patient, who gave verbal consent to proceed.  History of Present Illness David Aguilar is a 54 year old male with prior right Achilles tendon injury who presents with persistent left foot pain following a skiing injury.  Three weeks ago, he sustained a left foot injury while skiing, experiencing significant strain during a right turn. Immediate soreness developed, with pain intensifying over subsequent days and localizing to the upper and medial aspect of the left foot. Initial lateral foot pain resolved. He describes persistent pain that does not impair ambulation, with associated swelling around the ankle, particularly after prolonged standing. He notes tenderness at the foot crease and along the medial aspect.  He has managed symptoms with ibuprofen , typically 800 mg at night and occasionally during the day, without a strict schedule. He recently began using a pneumatic walking boot, which has provided significant relief during ambulation. He is unfamiliar with boot use but reports an adequate fit. He has considered using a TENS unit and has access to an ankle brace with adjustable straps from prior tennis injuries.  He has not resumed tennis since a partial right Achilles tendon tear in October 2021, for which he was prescribed meloxicam . He is currently avoiding high-impact activities due to ongoing left foot pain.     Past Medical History:  Diagnosis Date   Allergy    Anxiety    f/by Psych   Hyperlipidemia    Hypertension    Neoplasm of pituitary gland 07/21/2015   f/by Dr. Tommas.  Secondary Hypotestosterone, etc.   Obesity    Past  Surgical History:  Procedure Laterality Date   COLONOSCOPY N/A    circa 2022 per [facility]   Allergies[1]  Physical Exam EXTREMITIES: Palpable pedal pulses left foot.  No ecchymosis or erythema noted.  Mild localized edema to the medial midfoot near the posterior tibial tendon.  Tenderness in the distal posterior tibial tendon, deltoid ligament, and anterior aspect of the left ankle.  No pain on palpation of the peroneal tendons.  No pain on palpation of the Achilles or the anterior tibialis tendon.  No pain with range of motion of the subtalar joint or ankle joint.  No crepitus noted.  No pain on palpation of the plantar fascia.  Epicritic sensation intact.    Results Radiology Left foot and ankle x-ray (08/27/2024): Normal. No acute bony abnormality. Joint space preserved. Normal bony alignment.  No fracture seen.  Normal osseous mineralization (Independently interpreted)    Assessment/Plan of Care: 1. Posterior tibial tendinitis, left   2. Injury of left foot, initial encounter      Meds ordered this encounter  Medications   meloxicam  (MOBIC ) 15 MG tablet    Sig: Take 1 tablet (15 mg total) by mouth as needed. One tablet daily.    Dispense:  30 tablet    Refill:  1   Assessment & Plan Left posterior tibial tendinitis and deltoid ligament sprain Subacute left posterior tibial tendinitis and deltoid ligament sprain following a skiing injury three weeks ago, consistent with overuse and forceful inversion. No evidence of bony injury  or Achilles tendon involvement. Prolonged recovery anticipated due to poor vascularity of the posterior tibial tendon. Conservative management is expected to result in gradual improvement, with risk of delayed healing if immobilization is inadequate or high-impact activities are resumed prematurely. - Continued immobilization with a walking boot for the majority of weight-bearing hours for a total of three weeks from injury. - Instructed to remove boot for  sleeping, bathing, and resting; apply ice packs to affected areas for 20 minutes as needed for swelling. - Advised to avoid high-impact activities for at least three weeks. - Prescribed meloxicam  for anti-inflammatory effect to reduce reliance on ibuprofen ; prescription sent to pharmacy. - Instructed on optional use of TENS unit for pain control and to potentially increase local blood flow. - Advised to trial regular shoes indoors at the end of week two if symptoms significantly improve, but to continue avoiding high-impact activities until cleared. - Plan to reassess in three weeks; if residual weakness persists, will transition to an adjustable ankle brace and initiate home rehabilitation with exercise bands. - Instructed to bring his own ankle brace to the next visit for evaluation of suitability for ongoing support.  Follow-up 3 weeks   Cohen Boettner DSABRA [place], DPM, FACFAS [facility]     [address] [place], [place] [address]                Office [phone]  Fax [phone]      [1]  Allergies Allergen Reactions   Bee Venom    "

## 2024-08-28 ENCOUNTER — Other Ambulatory Visit (HOSPITAL_BASED_OUTPATIENT_CLINIC_OR_DEPARTMENT_OTHER): Payer: Self-pay | Admitting: Family Medicine

## 2024-08-28 DIAGNOSIS — N529 Male erectile dysfunction, unspecified: Secondary | ICD-10-CM

## 2024-09-04 ENCOUNTER — Other Ambulatory Visit: Payer: Self-pay

## 2024-09-19 ENCOUNTER — Ambulatory Visit: Admitting: Podiatry

## 2025-05-28 ENCOUNTER — Encounter (HOSPITAL_BASED_OUTPATIENT_CLINIC_OR_DEPARTMENT_OTHER): Admitting: Family Medicine
